# Patient Record
Sex: Female | Born: 1985
Health system: Southern US, Community
[De-identification: ages and names within clinical notes are randomized; demographics above are authoritative.]

## PROBLEM LIST (undated history)

## (undated) DIAGNOSIS — A749 Chlamydial infection, unspecified: Secondary | ICD-10-CM

## (undated) DIAGNOSIS — B999 Unspecified infectious disease: Secondary | ICD-10-CM

## (undated) DIAGNOSIS — J45909 Unspecified asthma, uncomplicated: Secondary | ICD-10-CM

## (undated) HISTORY — PX: INDUCED ABORTION: SHX677

---
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
## 2007-02-04 ENCOUNTER — Inpatient Hospital Stay (HOSPITAL_COMMUNITY): Admission: AD | Admit: 2007-02-04 | Discharge: 2007-02-04 | Payer: Self-pay | Admitting: Family Medicine

## 2007-02-09 ENCOUNTER — Inpatient Hospital Stay (HOSPITAL_COMMUNITY): Admission: AD | Admit: 2007-02-09 | Discharge: 2007-02-10 | Payer: Self-pay | Admitting: Family Medicine

## 2007-07-05 ENCOUNTER — Inpatient Hospital Stay (HOSPITAL_COMMUNITY): Admission: AD | Admit: 2007-07-05 | Discharge: 2007-07-05 | Payer: Self-pay | Admitting: Obstetrics & Gynecology

## 2007-07-05 ENCOUNTER — Ambulatory Visit: Payer: Self-pay | Admitting: *Deleted

## 2007-07-13 ENCOUNTER — Ambulatory Visit: Payer: Self-pay | Admitting: *Deleted

## 2007-07-15 ENCOUNTER — Ambulatory Visit (HOSPITAL_COMMUNITY): Admission: RE | Admit: 2007-07-15 | Discharge: 2007-07-15 | Payer: Self-pay | Admitting: Obstetrics and Gynecology

## 2007-07-20 ENCOUNTER — Ambulatory Visit: Payer: Self-pay | Admitting: Obstetrics & Gynecology

## 2007-07-27 ENCOUNTER — Inpatient Hospital Stay (HOSPITAL_COMMUNITY): Admission: AD | Admit: 2007-07-27 | Discharge: 2007-07-27 | Payer: Self-pay | Admitting: Obstetrics & Gynecology

## 2007-07-27 ENCOUNTER — Ambulatory Visit: Payer: Self-pay | Admitting: Obstetrics & Gynecology

## 2007-07-31 ENCOUNTER — Ambulatory Visit: Payer: Self-pay | Admitting: Obstetrics and Gynecology

## 2007-07-31 ENCOUNTER — Inpatient Hospital Stay (HOSPITAL_COMMUNITY): Admission: AD | Admit: 2007-07-31 | Discharge: 2007-08-02 | Payer: Self-pay | Admitting: Obstetrics and Gynecology

## 2008-04-13 ENCOUNTER — Inpatient Hospital Stay (HOSPITAL_COMMUNITY): Admission: AD | Admit: 2008-04-13 | Discharge: 2008-04-13 | Payer: Self-pay | Admitting: Obstetrics & Gynecology

## 2008-07-15 ENCOUNTER — Ambulatory Visit: Payer: Self-pay | Admitting: Obstetrics and Gynecology

## 2008-07-15 ENCOUNTER — Inpatient Hospital Stay (HOSPITAL_COMMUNITY): Admission: AD | Admit: 2008-07-15 | Discharge: 2008-07-15 | Payer: Self-pay | Admitting: Family Medicine

## 2008-08-18 ENCOUNTER — Inpatient Hospital Stay (HOSPITAL_COMMUNITY): Admission: AD | Admit: 2008-08-18 | Discharge: 2008-08-18 | Payer: Self-pay | Admitting: Obstetrics and Gynecology

## 2008-08-18 ENCOUNTER — Ambulatory Visit: Payer: Self-pay | Admitting: Obstetrics and Gynecology

## 2008-10-21 ENCOUNTER — Inpatient Hospital Stay (HOSPITAL_COMMUNITY): Admission: AD | Admit: 2008-10-21 | Discharge: 2008-10-21 | Payer: Self-pay | Admitting: Obstetrics & Gynecology

## 2008-10-23 ENCOUNTER — Ambulatory Visit: Payer: Self-pay | Admitting: Family Medicine

## 2008-10-23 ENCOUNTER — Inpatient Hospital Stay (HOSPITAL_COMMUNITY): Admission: AD | Admit: 2008-10-23 | Discharge: 2008-10-26 | Payer: Self-pay | Admitting: Obstetrics & Gynecology

## 2010-07-19 ENCOUNTER — Emergency Department (HOSPITAL_COMMUNITY)
Admission: EM | Admit: 2010-07-19 | Discharge: 2010-07-19 | Payer: Self-pay | Source: Home / Self Care | Admitting: Emergency Medicine

## 2010-11-20 LAB — TYPE AND SCREEN
Antibody Screen: POSITIVE
DAT, IgG: NEGATIVE

## 2010-11-20 LAB — RUBELLA SCREEN: Rubella: 46.5 IU/mL — ABNORMAL HIGH

## 2010-11-20 LAB — ABO/RH: ABO/RH(D): B POS

## 2010-11-20 LAB — STREP B DNA PROBE

## 2010-11-20 LAB — HIV ANTIBODY (ROUTINE TESTING W REFLEX): HIV: NONREACTIVE

## 2010-11-20 LAB — CBC
HCT: 26.4 % — ABNORMAL LOW (ref 36.0–46.0)
HCT: 26.1 % — ABNORMAL LOW (ref 36.0–46.0)
Hemoglobin: 8.7 g/dL — ABNORMAL LOW (ref 12.0–15.0)
MCHC: 30.9 g/dL (ref 30.0–36.0)
MCV: 60.1 fL — ABNORMAL LOW (ref 78.0–100.0)
MCV: 60.3 fL — ABNORMAL LOW (ref 78.0–100.0)
Platelets: 210 10*3/uL (ref 150–400)
Platelets: 220 10*3/uL (ref 150–400)
Platelets: 237 10*3/uL (ref 150–400)
RBC: 4.23 MIL/uL (ref 3.87–5.11)
RBC: 4.38 MIL/uL (ref 3.87–5.11)
RDW: 23.8 % — ABNORMAL HIGH (ref 11.5–15.5)
RDW: 24 % — ABNORMAL HIGH (ref 11.5–15.5)

## 2010-11-20 LAB — HEPATITIS B SURFACE ANTIGEN: Hepatitis B Surface Ag: NEGATIVE

## 2010-11-20 LAB — RPR
RPR Ser Ql: NONREACTIVE
RPR Ser Ql: NONREACTIVE

## 2010-11-20 LAB — RAPID URINE DRUG SCREEN, HOSP PERFORMED
Amphetamines: NOT DETECTED
Cocaine: NOT DETECTED

## 2010-11-24 LAB — URINALYSIS, ROUTINE W REFLEX MICROSCOPIC
Protein, ur: 30 mg/dL — AB
Specific Gravity, Urine: >1.03 — ABNORMAL HIGH (ref 1.005–1.030)
Urobilinogen, UA: 0.2 mg/dL (ref 0.0–1.0)

## 2010-11-24 LAB — RAPID URINE DRUG SCREEN, HOSP PERFORMED
Benzodiazepines: NOT DETECTED
Cocaine: NOT DETECTED
Tetrahydrocannabinol: NOT DETECTED

## 2010-11-24 LAB — URINE MICROSCOPIC-ADD ON

## 2010-11-24 LAB — WET PREP, GENITAL
Clue Cells Wet Prep HPF POC: NONE SEEN
Trich, Wet Prep: NONE SEEN

## 2010-12-23 NOTE — Op Note (Signed)
NAMEBRYER, GOTTSCH NO.:  [id number]   MEDICAL RECORD NO.:  [id number]          PATIENT TYPE:  INP   LOCATION:  9114                          FACILITY:  [facility]   PHYSICIAN:  Scheryl Darter, MD       DATE OF BIRTH:  Nov 29, 1985   DATE OF PROCEDURE:  10/22/2008  DATE OF DISCHARGE:                               OPERATIVE REPORT   DESCRIPTION OF DELIVERY:  Ms. Valencia Kassa 25 year old gravida 2, now  para 2 who presented in advanced labor.  She had no prenatal care during  this pregnancy.  Based on last menstrual period and confirmation by an  ultrasound at 30 weeks, the patient was [redacted] weeks gestational age.  She  progressed to complete, complete with adequate pushing efforts and  delivered via spontaneous vaginal delivery.  The head presented right  occiput anterior and a shoulder dystocia was quickly noted.  There was  no nuchal cord.  Nurses put the patient in McRoberts position and  applied suprapubic pressure.  A Woods screw maneuver was done to rotate  the anterior shoulder, which was the baby's left shoulder towards the  patient's left side which facilitate delivery of the anterior shoulder.  Total time of dystocia was approximately 50 seconds.  The anterior  shoulder was then delivered spontaneously followed by the posterior  shoulder and the rest of the corpus without difficulty.  The mouth and  nares were bulb suctioned.  The cord was clamped and cut, and then  transfered to the warmer with the awaiting delivery room nurse, there  was a spontaneous cry, good tone.  Both arms were noted to be moving.  The placenta then delivered spontaneously and intact.  There was a three-  vessel cord.  There were no lacerations noted upon delivery of placenta.  Some bleeding was noted.  This responded to uterine massage.  Then,  another episode of bleeding occurred which the patient was then given  Methergine IM as well as Cytotec PR with an additional 20 units of  Pitocin  placed in the IV bag.  Bleeding decreased at this time.  Estimated blood loss was 700 mL.  Mother and baby are in good condition.      Odie Sera, DO      Scheryl Darter, MD  Electronically Signed    MC/MEDQ  D:  10/24/2008  T:  10/24/2008  Job:  [id number]

## 2011-05-13 LAB — WET PREP, GENITAL: Yeast Wet Prep HPF POC: NONE SEEN

## 2011-05-15 LAB — GC/CHLAMYDIA PROBE AMP, GENITAL
Chlamydia, DNA Probe: POSITIVE — AB
GC Probe Amp, Genital: POSITIVE — AB

## 2011-05-15 LAB — CBC
HCT: 18 — ABNORMAL LOW
HCT: 20.6 — ABNORMAL LOW
HCT: 29.3 — ABNORMAL LOW
Hemoglobin: 5.3 — CL
Hemoglobin: 6 — CL
Hemoglobin: 7 — CL
Hemoglobin: 9.7 — ABNORMAL LOW
Hemoglobin: 9.9 — ABNORMAL LOW
MCHC: 33.1
MCHC: 33.5
MCHC: 34.4
MCV: 69.4 — ABNORMAL LOW
MCV: 70.1 — ABNORMAL LOW
MCV: 72.6 — ABNORMAL LOW
Platelets: 193
Platelets: 216
Platelets: 227
Platelets: 264
RBC: 2.83 — ABNORMAL LOW
RBC: 4.18
RBC: 4.23
RDW: 18.4 — ABNORMAL HIGH
RDW: 18.5 — ABNORMAL HIGH
RDW: 18.7 — ABNORMAL HIGH
WBC: 10.1
WBC: 20.7 — ABNORMAL HIGH
WBC: 8

## 2011-05-15 LAB — CROSSMATCH
Antibody Screen: POSITIVE
DAT, IgG: NEGATIVE

## 2011-05-15 LAB — URINALYSIS, ROUTINE W REFLEX MICROSCOPIC
Bilirubin Urine: NEGATIVE
Glucose, UA: NEGATIVE mg/dL
Hgb urine dipstick: NEGATIVE
Ketones, ur: NEGATIVE mg/dL
Nitrite: NEGATIVE
Protein, ur: NEGATIVE mg/dL
Specific Gravity, Urine: 1.02 (ref 1.005–1.030)
Urobilinogen, UA: 0.2 mg/dL (ref 0.0–1.0)
pH: 7 (ref 5.0–8.0)

## 2011-05-15 LAB — URINE CULTURE: Colony Count: 2000

## 2011-05-15 LAB — COMPREHENSIVE METABOLIC PANEL
ALT: 11
Albumin: 2.5 — ABNORMAL LOW
Alkaline Phosphatase: 153 — ABNORMAL HIGH
BUN: 2 — ABNORMAL LOW
Chloride: 103
Glucose, Bld: 91
Potassium: 3.6
Sodium: 133 — ABNORMAL LOW
Total Bilirubin: 0.6
Total Protein: 7.3

## 2011-05-15 LAB — POCT URINALYSIS DIP (DEVICE)
Bilirubin Urine: NEGATIVE
Glucose, UA: NEGATIVE
Ketones, ur: NEGATIVE
Nitrite: NEGATIVE
Operator id: 148111

## 2011-05-15 LAB — RPR
RPR Ser Ql: NONREACTIVE
RPR Ser Ql: NONREACTIVE

## 2011-05-15 LAB — WET PREP, GENITAL

## 2011-05-15 LAB — LACTATE DEHYDROGENASE: LDH: 156

## 2011-05-15 LAB — URIC ACID: Uric Acid, Serum: 5.2

## 2011-05-18 LAB — POCT URINALYSIS DIP (DEVICE)
Bilirubin Urine: NEGATIVE
Glucose, UA: NEGATIVE
Glucose, UA: NEGATIVE
Hgb urine dipstick: NEGATIVE
Nitrite: NEGATIVE
Nitrite: NEGATIVE
Urobilinogen, UA: 0.2
Urobilinogen, UA: 1
pH: 6.5

## 2011-05-19 LAB — URINALYSIS, ROUTINE W REFLEX MICROSCOPIC
Bilirubin Urine: NEGATIVE
Hgb urine dipstick: NEGATIVE
Nitrite: NEGATIVE
Protein, ur: NEGATIVE
Urobilinogen, UA: 0.2

## 2011-05-19 LAB — RUBELLA SCREEN: Rubella: 47.2 — ABNORMAL HIGH

## 2011-05-19 LAB — RAPID URINE DRUG SCREEN, HOSP PERFORMED
Amphetamines: NOT DETECTED
Barbiturates: NOT DETECTED
Opiates: NOT DETECTED

## 2011-05-19 LAB — SICKLE CELL SCREEN: Sickle Cell Screen: NEGATIVE

## 2011-05-19 LAB — RPR: RPR Ser Ql: NONREACTIVE

## 2011-05-19 LAB — TYPE AND SCREEN
ABO/RH(D): B POS
Antibody Screen: POSITIVE
PT AG Type: NEGATIVE

## 2011-05-19 LAB — DIFFERENTIAL
Eosinophils Relative: 4
Lymphocytes Relative: 34
Lymphs Abs: 3.1
Monocytes Absolute: 1.2 — ABNORMAL HIGH
Monocytes Relative: 13 — ABNORMAL HIGH

## 2011-05-19 LAB — WET PREP, GENITAL: Yeast Wet Prep HPF POC: NONE SEEN

## 2011-05-19 LAB — CBC
HCT: 28.1 — ABNORMAL LOW
Hemoglobin: 9.3 — ABNORMAL LOW
Platelets: 260
RBC: 3.86 — ABNORMAL LOW
WBC: 9.2

## 2011-05-19 LAB — STREP B DNA PROBE: Strep Group B Ag: NEGATIVE

## 2011-05-19 LAB — HEPATITIS B SURFACE ANTIGEN: Hepatitis B Surface Ag: NEGATIVE

## 2011-05-26 LAB — URINE MICROSCOPIC-ADD ON

## 2011-05-26 LAB — WET PREP, GENITAL

## 2011-05-26 LAB — URINALYSIS, ROUTINE W REFLEX MICROSCOPIC
Specific Gravity, Urine: <1.005 — ABNORMAL LOW
Urobilinogen, UA: 0.2
pH: 6.5

## 2011-05-27 LAB — URINE CULTURE: Colony Count: 80000

## 2011-05-27 LAB — URINALYSIS, ROUTINE W REFLEX MICROSCOPIC
Glucose, UA: NEGATIVE
Nitrite: NEGATIVE
Specific Gravity, Urine: >1.03 — ABNORMAL HIGH
pH: 6

## 2011-05-27 LAB — URINE MICROSCOPIC-ADD ON

## 2011-05-27 LAB — WET PREP, GENITAL

## 2013-08-10 NOTE — L&D Delivery Note (Signed)
  Lucila MaineStroman, Girl Britta MccreedySharine [[id number]][[id number]]  Delivery Note At 8:37 PM a viable female was delivered via Vaginal, Spontaneous Delivery (Presentation: ;  ).  APGAR:pending weight 5 lb 13.7 oz (2655 g).   Placenta status: intact Cord:  with the following complications: .    Anesthesia:  none Episiotomy:  none Lacerations:  none Est. Blood Loss (mL):  400cc  Mom to OR for delivery of Twin B .  Baby to Nursery.   HARRAWAY-SMITH, Mckell Riecke 08/08/2014, 9:54 PM     Alda PonderStroman, GirlB Nicolasa [[id number]][[id number]]  Delivery Note At 8:53 PM a viable female was delivered via  Primary c-section.  APGAR: 4, 8; weight 5 lb 11.9 oz (2605 g).   Placenta status:intact .  Cord: 3 vessels  .    Anesthesia:  general Episiotomy:  n/a Lacerations:  N/a  Mom to PACU then postpartum.  Baby to Nursery.  HARRAWAY-SMITH, Darald Uzzle 08/08/2014, 9:54 PM

## 2013-09-25 ENCOUNTER — Ambulatory Visit: Payer: Self-pay

## 2014-07-19 ENCOUNTER — Encounter (HOSPITAL_COMMUNITY): Payer: Self-pay | Admitting: *Deleted

## 2014-07-19 ENCOUNTER — Inpatient Hospital Stay (HOSPITAL_COMMUNITY): Payer: Medicaid Other

## 2014-07-19 ENCOUNTER — Inpatient Hospital Stay (HOSPITAL_COMMUNITY)
Admission: AD | Admit: 2014-07-19 | Discharge: 2014-07-26 | DRG: 782 | Disposition: A | Discharge status: Home / Self Care | Payer: Medicaid Other | Source: Ambulatory Visit | Attending: Obstetrics & Gynecology | Admitting: Obstetrics & Gynecology

## 2014-07-19 DIAGNOSIS — Z3A34 34 weeks gestation of pregnancy: Secondary | ICD-10-CM

## 2014-07-19 DIAGNOSIS — O322XX0 Maternal care for transverse and oblique lie, not applicable or unspecified: Secondary | ICD-10-CM | POA: Diagnosis present

## 2014-07-19 DIAGNOSIS — O4693 Antepartum hemorrhage, unspecified, third trimester: Secondary | ICD-10-CM | POA: Diagnosis present

## 2014-07-19 DIAGNOSIS — O0930 Supervision of pregnancy with insufficient antenatal care, unspecified trimester: Secondary | ICD-10-CM

## 2014-07-19 DIAGNOSIS — Z880 Allergy status to penicillin: Secondary | ICD-10-CM

## 2014-07-19 DIAGNOSIS — O30003 Twin pregnancy, unspecified number of placenta and unspecified number of amniotic sacs, third trimester: Secondary | ICD-10-CM | POA: Diagnosis present

## 2014-07-19 DIAGNOSIS — Z3A35 35 weeks gestation of pregnancy: Secondary | ICD-10-CM | POA: Diagnosis present

## 2014-07-19 DIAGNOSIS — O0933 Supervision of pregnancy with insufficient antenatal care, third trimester: Secondary | ICD-10-CM

## 2014-07-19 DIAGNOSIS — R58 Hemorrhage, not elsewhere classified: Secondary | ICD-10-CM

## 2014-07-19 HISTORY — DX: Unspecified infectious disease: B99.9

## 2014-07-19 HISTORY — DX: Chlamydial infection, unspecified: A74.9

## 2014-07-19 LAB — OB RESULTS CONSOLE GBS: GBS: NEGATIVE

## 2014-07-19 LAB — COMPREHENSIVE METABOLIC PANEL
ALBUMIN: 2.5 g/dL — AB (ref 3.5–5.2)
ALK PHOS: 134 U/L — AB (ref 39–117)
ALT: 7 U/L (ref 0–35)
AST: 15 U/L (ref 0–37)
Anion gap: 12 (ref 5–15)
BILIRUBIN TOTAL: 0.5 mg/dL (ref 0.3–1.2)
BUN: 3 mg/dL — AB (ref 6–23)
CHLORIDE: 99 meq/L (ref 96–112)
CO2: 25 mEq/L (ref 19–32)
Calcium: 9 mg/dL (ref 8.4–10.5)
Creatinine, Ser: 0.53 mg/dL (ref 0.50–1.10)
GFR calc Af Amer: >90 mL/min (ref >90)
GFR calc non Af Amer: >90 mL/min (ref >90)
Glucose, Bld: 76 mg/dL (ref 70–99)
POTASSIUM: 3.3 meq/L — AB (ref 3.7–5.3)
SODIUM: 136 meq/L — AB (ref 137–147)
TOTAL PROTEIN: 6.8 g/dL (ref 6.0–8.3)

## 2014-07-19 LAB — DIFFERENTIAL
BASOS ABS: 0 10*3/uL (ref 0.0–0.1)
Basophils Relative: 0 % (ref 0–1)
EOS ABS: 0.3 10*3/uL (ref 0.0–0.7)
Eosinophils Relative: 3 % (ref 0–5)
LYMPHS PCT: 30 % (ref 12–46)
Lymphs Abs: 2.8 10*3/uL (ref 0.7–4.0)
MONOS PCT: 9 % (ref 3–12)
Monocytes Absolute: 0.8 10*3/uL (ref 0.1–1.0)
Neutro Abs: 5.3 10*3/uL (ref 1.7–7.7)
Neutrophils Relative %: 58 % (ref 43–77)

## 2014-07-19 LAB — CBC
HCT: 27.6 % — ABNORMAL LOW (ref 36.0–46.0)
Hemoglobin: 9.6 g/dL — ABNORMAL LOW (ref 12.0–15.0)
MCH: 25.8 pg — ABNORMAL LOW (ref 26.0–34.0)
MCHC: 34.8 g/dL (ref 30.0–36.0)
MCV: 74.2 fL — ABNORMAL LOW (ref 78.0–100.0)
PLATELETS: 174 10*3/uL (ref 150–400)
RBC: 3.72 MIL/uL — ABNORMAL LOW (ref 3.87–5.11)
RDW: 14.5 % (ref 11.5–15.5)
WBC: 9.2 10*3/uL (ref 4.0–10.5)

## 2014-07-19 LAB — WET PREP, GENITAL
Trich, Wet Prep: NONE SEEN
Yeast Wet Prep HPF POC: NONE SEEN

## 2014-07-19 LAB — HIV ANTIBODY (ROUTINE TESTING W REFLEX): HIV: NONREACTIVE

## 2014-07-19 LAB — RAPID URINE DRUG SCREEN, HOSP PERFORMED
Amphetamines: NOT DETECTED
BARBITURATES: NOT DETECTED
BENZODIAZEPINES: NOT DETECTED
Cocaine: NOT DETECTED
Opiates: NOT DETECTED
Tetrahydrocannabinol: POSITIVE — AB

## 2014-07-19 LAB — HEPATITIS B SURFACE ANTIGEN: HEP B S AG: NEGATIVE

## 2014-07-19 LAB — OB RESULTS CONSOLE GC/CHLAMYDIA
CHLAMYDIA, DNA PROBE: NEGATIVE
GC PROBE AMP, GENITAL: NEGATIVE

## 2014-07-19 LAB — RPR

## 2014-07-19 LAB — GROUP B STREP BY PCR: Group B strep by PCR: NEGATIVE

## 2014-07-19 LAB — RAPID HIV SCREEN (WH-MAU): Rapid HIV Screen: NONREACTIVE

## 2014-07-19 MED ORDER — ACETAMINOPHEN 325 MG PO TABS: 650.0000 mg | ORAL_TABLET | ORAL | Status: DC | PRN

## 2014-07-19 MED ORDER — LACTATED RINGERS IV BOLUS (SEPSIS): 1000.0000 mL | Freq: Once | INTRAVENOUS | Status: DC

## 2014-07-19 MED ORDER — FERROUS SULFATE 325 (65 FE) MG PO TABS: 325.0000 mg | ORAL_TABLET | Freq: Two times a day (BID) | ORAL | Status: DC

## 2014-07-19 MED ORDER — FERROUS SULFATE 325 (65 FE) MG PO TABS
325.0000 mg | ORAL_TABLET | Freq: Two times a day (BID) | ORAL | Status: DC
  Administered 2014-07-20 – 2014-07-26 (×12): 325 mg via ORAL
  Filled 2014-07-19 (×12): qty 1

## 2014-07-19 MED ORDER — LACTATED RINGERS IV SOLN
INTRAVENOUS | Status: DC
  Administered 2014-07-19 – 2014-07-20 (×2): via INTRAVENOUS

## 2014-07-19 MED ORDER — CALCIUM CARBONATE ANTACID 500 MG PO CHEW: 2.0000 | CHEWABLE_TABLET | ORAL | Status: DC | PRN

## 2014-07-19 MED ORDER — PRENATAL MULTIVITAMIN CH
1.0000 | ORAL_TABLET | Freq: Every day | ORAL | Status: DC
  Administered 2014-07-20 – 2014-07-26 (×7): 1 via ORAL
  Filled 2014-07-19 (×7): qty 1

## 2014-07-19 MED ORDER — ZOLPIDEM TARTRATE 5 MG PO TABS: 5.0000 mg | ORAL_TABLET | Freq: Every evening | ORAL | Status: DC | PRN

## 2014-07-19 MED ORDER — DOCUSATE SODIUM 100 MG PO CAPS
100.0000 mg | ORAL_CAPSULE | Freq: Every day | ORAL | Status: DC
  Administered 2014-07-20 – 2014-07-26 (×7): 100 mg via ORAL
  Filled 2014-07-19 (×7): qty 1

## 2014-07-19 NOTE — MAU Note (Signed)
EMS pt report: 8 months preg; no prenatal care;  Felt wet, thought her water had broke, apporx 50cc blood noted.  Denies ctx's.

## 2014-07-19 NOTE — Consult Note (Signed)
Asked by Dr.Arnold to provide prenatal consultation for patient at risk for preterm delivery due to possible abruption..  Mother is 28 y.o. G6 P2-0-3-2 who is now 7934 3/[redacted] weeks EGA by LMP, but uncertain dates since her last period was "light" and she has had no prenatal care.  Had onset heavy bleeding today, which has subsided since admission. Ultrasound shows di-di twins, both female, size consistent with EGA 34+wks.  Discussed usual expectations for preterm infants at 3034 - [redacted] weeks gestation, including possible respiratory distress, feeding problems, hypoglycemia, hypothermia and possible need for care in NICU.  Projected possible length of stay in NICU until 36 - [redacted] wks EGA.  Noted slightly increased risk of twins vs singletons at same size, EGA.  Discussed advantages of feeding with mother's milk.  She plans to formula feed but agreed to pumping postnatally for benefits of early feedings. .  Patient was attentive, had appropriate questions, and was appreciative of my input.  Thank you for the consultation.  Total time 25 minute  Thanks for consulting Neonatology  Daire Okimoto E. Barrie DunkerWimmer, Jr., MD

## 2014-07-19 NOTE — Progress Notes (Signed)
CSW received call from MAU requesting CSW consult for patient due to patient arriving at [redacted] weeks gestation, with no prenatal care, and just learning of being pregnant with twins.  The CSW met with the patient and her significant other.  They presented as receptive to the visit and were easily engaged.  The patient displayed a full range in affect and presented in a pleasant mood.   The patient and her significant other shared that they were not surprised when they were learned that they were pregnancy with twins since "the pregnancy felt different than the others" and "I got large, very fast".  They also disclosed family history of twins.  The patient denied stress now that they know that they have twins since "we'll just have to go back and get one more of everything".  The patient and her significant other indicated limited support as they shared that they rely on each other and expressed hope that they will receive additional support once the twins arrive.   CSW inquired about late prenatal care.  The patient reported that they had thought about going to the MD about a month ago, but due to the significant other's work schedule, attempting to secure child care, and then there car breaking down, they were unable to make an appointment.  They reported that they are now able to attend appointments since the car has been repaired.  They denied additional barriers to prenatal care.   CSW provided education hospital drug screen for [facility].  The patient denied any substance use and verbalized understanding of protocol once the twins are born.    No barriers to discharge.

## 2014-07-19 NOTE — MAU Note (Signed)
Was laying down, just started bleeding. No water leaking. No pain.

## 2014-07-19 NOTE — MAU Provider Note (Signed)
History     CSN: [id number]  Arrival date and time: 07/19/14 1137   First Provider Initiated Contact with Patient 07/19/14 1246      Chief Complaint  Patient presents with  . Vaginal Bleeding   HPI  Ms. Victoria Mclaughlin is a 28 year old G6P2032, EGA of 7669w3d based on LMP, presenting to MAU via EMS with complaints of vaginal bleeding. She states last night at 11PM she laid down to rest and felt the baby kick and some discomfort. She then said in the morning she thought her water broke around 10:30AM. She states she felt down there and then looked at her hand and it was bloody. She then proceeded to go to the bathroom where she noted a large pool of blood on the bathroom floor. The blood color was heavy medium red. She states she has not had any problems throughout this pregnancy and no problems during prior pregnancies. She has not had any prenatal care. She denies feeling any contractions or any abnormal vaginal discharge. She states she can feel the baby move. Patient also notes she believes her LMP is on April 13, but she is unsure due to prior period was abnormally light.    Past Medical History  Diagnosis Date  . Infection     UTI  . Chlamydia     Past Surgical History  Procedure Laterality Date  . Induced abortion      x3    Family History  Problem Relation Age of Onset  . Stroke Maternal Aunt   . Hearing loss Neg Hx   . Diabetes Neg Hx   . Heart disease Neg Hx     History  Substance Use Topics  . Smoking status: Never Smoker   . Smokeless tobacco: Never Used  . Alcohol Use: No    Allergies:  Allergies  Allergen Reactions  . Penicillins Hives    Prescriptions prior to admission  Medication Sig Dispense Refill Last Dose  . Prenatal Vit-Fe Fumarate-FA (PRENATAL MULTIVITAMIN) TABS tablet Take 1 tablet by mouth daily at 12 noon.   07/18/2014 at Unknown time    Review of Systems  Constitutional: Negative for fever.  Respiratory: Negative for shortness of breath.    Cardiovascular: Negative for chest pain.  Gastrointestinal: Negative for nausea, vomiting and abdominal pain.  Genitourinary: Negative for dysuria.  Neurological: Negative for dizziness, weakness and headaches.   Physical Exam   Blood pressure 134/76, pulse 121, temperature 97.9 F (36.6 C), temperature source Oral, resp. rate 18, height 5\' 4"  (1.626 m), weight 99.791 kg (220 lb), last menstrual period 11/20/2013.  Physical Exam  Constitutional: She appears well-developed and well-nourished.  HENT:  Head: Normocephalic.  Cardiovascular: Regular rhythm and normal heart sounds.   Tachycardic   Respiratory: Effort normal and breath sounds normal. No respiratory distress.  GI:  Gravida  Musculoskeletal:  Right and left foot display dependent edema  Neurological: She is alert.  Skin: Skin is warm and dry.    MAU Course  Procedures  MDM Due to patient stating she lost a lot of blood this morning on the bathroom floor, will insert two large bore IVs. Will bolus patient due to tachycardia and blood loss. Will order prenatal tests due to lack of prenatal care. Will also order u/s to rule out placenta previa.   Assessment and Plan    Karma LewFong, Chelsea K 07/19/2014, 1:29 PM   I was present for the exam and agree with above.  [place] Kayo [place], [place] 07/19/2014 8:24  PM   

## 2014-07-19 NOTE — H&P (Signed)
History     CSN: [id number]  Arrival date and time: 07/19/14 1137  First Provider Initiated Contact with Patient 07/19/14 1246    Chief Complaint  Patient presents with  . Vaginal Bleeding   HPI  Victoria Mclaughlin is a 28 year old G6P2032, EGA of 5847w3d based on LMP, presenting to MAU via EMS with complaints of vaginal bleeding. She states last night at 11PM she laid down to rest and felt the baby kick and some discomfort. She then said in the morning she thought her water broke around 10:30AM. She states she felt down there and then looked at her hand and it was bloody. She then proceeded to go to the bathroom where she noted a large pool of blood on the bathroom floor. The blood color was heavy medium red. She states she has not had any problems throughout this pregnancy and no problems during prior pregnancies. She has not had any prenatal care. She denies feeling any contractions or any abnormal vaginal discharge. She states she can feel the baby move. Patient also notes she believes her LMP is on April 13, but she is unsure due to prior period was abnormally light.    Past Medical History  Diagnosis Date  . Infection     UTI  . Chlamydia     Past Surgical History  Procedure Laterality Date  . Induced abortion      x3    Family History  Problem Relation Age of Onset  . Stroke Maternal Aunt   . Hearing loss Neg Hx   . Diabetes Neg Hx   . Heart disease Neg Hx     History  Substance Use Topics  . Smoking status: Never Smoker   . Smokeless tobacco: Never Used  . Alcohol Use: No    Allergies:  Allergies  Allergen Reactions  . Penicillins Hives    Prescriptions prior to admission  Medication Sig Dispense Refill Last Dose  . Prenatal Vit-Fe Fumarate-FA (PRENATAL MULTIVITAMIN) TABS tablet Take 1 tablet by mouth daily at 12 noon.   07/18/2014 at Unknown time    Review of  Systems  Constitutional: Negative for fever.  Respiratory: Negative for shortness of breath.  Cardiovascular: Negative for chest pain.  Gastrointestinal: Negative for nausea, vomiting and abdominal pain.  Genitourinary: Negative for dysuria.  Neurological: Negative for dizziness, weakness and headaches.   Physical Exam   Blood pressure 134/76, pulse 121, temperature 97.9 F (36.6 C), temperature source Oral, resp. rate 18, height 5\' 4"  (1.626 m), weight 99.791 kg (220 lb), last menstrual period 11/20/2013.  Physical Exam  Constitutional: She appears well-developed and well-nourished.  HENT:  Head: Normocephalic.  Cardiovascular: Regular rhythm and normal heart sounds.  Tachycardic  Respiratory: Effort normal and breath sounds normal. No respiratory distress.  GI:  Gravida  Musculoskeletal:  Right and left foot display dependent edema  Neurological: She is alert.  Skin: Skin is warm and dry.    MAU Course  Procedures  MDM Due to patient stating she lost a lot of blood this morning on the bathroom floor, will insert two large bore IVs. Will bolus patient due to tachycardia and blood loss. Will order prenatal tests due to lack of prenatal care. Will also order u/s to rule out placenta previa.  Assessment and Plan    Karma LewFong, Chelsea K 07/19/2014, 1:29 PM   US shows DI/Di twin IUP, no previa or abruption seen. Normal fluid. Dates match LMP. CL 3.6 cm.   PELVIC EXAM: NEFG,  small -mod amount of BRB. No LOF. Cervix clean.   Cervix 1/long/ballotable, vtx twin A  FHR reactive x 2 Few, mild contractions  Assessment: 1. 34.3 week IUP w/ Third trimester bleeding, possible abruption. Bleeding stable wile in MAU. 2. Di/Di Twins 3. Fetal Wellbeing: Category I x 2  4. No PNC 5. GBS, UDS Pending  Plan:  1. Admit to Antenatal per consult with Dr. Debroah LoopArnold 2. Routine Ante orders 3. NICU and SW consults 4. 1 hour GTT in am [facility]. [facility]  I was present for the exam and  agree with above.  [place] Jaiyah Beining, CNM 07/19/2014 8:24 PM

## 2014-07-19 NOTE — MAU Note (Signed)
C/o heavy medium red bleeding this AM around 1100;

## 2014-07-20 LAB — GLUCOSE TOLERANCE, 1 HOUR: Glucose, 1 Hour GTT: 143 mg/dL — ABNORMAL HIGH (ref 70–140)

## 2014-07-20 LAB — RUBELLA SCREEN: RUBELLA: 1.78 {index} — AB (ref <0.90)

## 2014-07-20 LAB — GC/CHLAMYDIA PROBE AMP
CT Probe RNA: NEGATIVE
GC Probe RNA: NEGATIVE

## 2014-07-20 NOTE — Progress Notes (Signed)
[facility]  CSW screening out referral due to patient being referred for the same reason yesterday and being seen by S. Venning/LCSW.

## 2014-07-20 NOTE — Progress Notes (Signed)
UR completed 

## 2014-07-20 NOTE — Progress Notes (Signed)
FACULTY PRACTICE ANTEPARTUM(COMPREHENSIVE) NOTE  Victoria CarrelSharine A Mclaughlin is a 28 y.o. [id number] at 3935w4d by LMP who is admitted for vaginal bleeding.   Fetal presentation is cephalic and transverse. Length of Stay:  1  Days  Subjective: Now just pink discharge Patient reports the fetal movement as active. Patient reports uterine contraction  activity as none. Patient reports  vaginal bleeding as scant staining. Patient describes fluid per vagina as None.  Vitals:  Blood pressure 117/79, pulse 104, temperature 98.5 F (36.9 C), temperature source Oral, resp. rate 24, height 5\' 4"  (1.626 m), weight 99.791 kg (220 lb), last menstrual period 11/20/2013. Physical Examination:  General appearance - alert, well appearing, and in no distress Heart - normal rate and regular rhythm Abdomen - soft, nontender, nondistended Fundal Height:  consistent with twins Membranes:intact   Most Recent FHR        Most Recent Value    Fetal Heart Rate A     Mode  External filed at 07/20/2014 0600    Baseline Rate (A)  125 bpm filed at 07/20/2014 0600    Variability  6-25 BPM filed at 07/20/2014 0600    Accelerations  15 x 15 filed at 07/20/2014 0600    Decelerations  None filed at 07/20/2014 0600    Multiple birth?  Yes filed at 07/19/2014 1628    Fetal Heart Rate Fetus B     Mode  External filed at 07/20/2014 0600    Baseline Rate (B)  135 BPM filed at 07/20/2014 0600    Variability  6-25 BPM filed at 07/20/2014 0600    Accelerations  15 x 15 filed at 07/20/2014 0600    Decelerations  None filed at 07/20/2014 0600      Labs:  Results for orders placed or performed during the hospital encounter of 07/19/14 (from the past 24 hour(s))  Hepatitis B surface antigen   Collection Time: 07/19/14  4:35 PM  Result Value Ref Range   Hepatitis B Surface Ag NEGATIVE NEGATIVE  RPR   Collection Time: 07/19/14  4:35 PM  Result Value Ref Range   RPR NON REAC NON REAC  CBC    Collection Time: 07/19/14  4:35 PM  Result Value Ref Range   WBC 9.2 4.0 - 10.5 K/uL   RBC 3.72 (L) 3.87 - 5.11 MIL/uL   Hemoglobin 9.6 (L) 12.0 - 15.0 g/dL   HCT 14.727.6 (L) 82.936.0 - 56.246.0 %   MCV 74.2 (L) 78.0 - 100.0 fL   MCH 25.8 (L) 26.0 - 34.0 pg   MCHC 34.8 30.0 - 36.0 g/dL   RDW 13.014.5 86.511.5 - 78.415.5 %   Platelets 174 150 - 400 K/uL  Differential   Collection Time: 07/19/14  4:35 PM  Result Value Ref Range   Neutrophils Relative % 58 43 - 77 %   Lymphocytes Relative 30 12 - 46 %   Monocytes Relative 9 3 - 12 %   Eosinophils Relative 3 0 - 5 %   Basophils Relative 0 0 - 1 %   Neutro Abs 5.3 1.7 - 7.7 K/uL   Lymphs Abs 2.8 0.7 - 4.0 K/uL   Monocytes Absolute 0.8 0.1 - 1.0 K/uL   Eosinophils Absolute 0.3 0.0 - 0.7 K/uL   Basophils Absolute 0.0 0.0 - 0.1 K/uL   Smear Review MORPHOLOGY UNREMARKABLE   HIV antibody (routine testing)   Collection Time: 07/19/14  4:35 PM  Result Value Ref Range   HIV 1&2 Ab, 4th Generation NONREACTIVE NONREACTIVE  Rapid HIV screen [facility] admission)   Collection Time: 07/19/14  4:35 PM  Result Value Ref Range   SUDS Rapid HIV Screen NON REACTIVE NON REACTIVE  Comprehensive metabolic panel   Collection Time: 07/19/14  4:35 PM  Result Value Ref Range   Sodium 136 (L) 137 - 147 mEq/L   Potassium 3.3 (L) 3.7 - 5.3 mEq/L   Chloride 99 96 - 112 mEq/L   CO2 25 19 - 32 mEq/L   Glucose, Bld 76 70 - 99 mg/dL   BUN 3 (L) 6 - 23 mg/dL   Creatinine, Ser 0.980.53 0.50 - 1.10 mg/dL   Calcium 9.0 8.4 - 11.910.5 mg/dL   Total Protein 6.8 6.0 - 8.3 g/dL   Albumin 2.5 (L) 3.5 - 5.2 g/dL   AST 15 0 - 37 U/L   ALT 7 0 - 35 U/L   Alkaline Phosphatase 134 (H) 39 - 117 U/L   Total Bilirubin 0.5 0.3 - 1.2 mg/dL   GFR calc non Af Amer >90 >90 mL/min   GFR calc Af Amer >90 >90 mL/min   Anion gap 12 5 - 15  Type and screen   Collection Time: 07/19/14  4:35 PM  Result Value Ref Range   ABO/RH(D) B POS    Antibody Screen POS    Sample Expiration 07/22/2014    Antibody  Identification ANTI LEA (Lewis a)    DAT, IgG NEG    Unit Number [id number]    Blood Component Type RED CELLS,LR    Unit division 00    Status of Unit ALLOCATED    Transfusion Status OK TO TRANSFUSE    Crossmatch Result COMPATIBLE    Unit Number [id number]    Blood Component Type RED CELLS,LR    Unit division 00    Status of Unit ALLOCATED    Transfusion Status OK TO TRANSFUSE    Crossmatch Result COMPATIBLE   Group B strep by PCR   Collection Time: 07/19/14  6:10 PM  Result Value Ref Range   Group B strep by PCR NEGATIVE NEGATIVE  Wet prep, genital   Collection Time: 07/19/14  6:10 PM  Result Value Ref Range   Yeast Wet Prep HPF POC NONE SEEN NONE SEEN   Trich, Wet Prep NONE SEEN NONE SEEN   Clue Cells Wet Prep HPF POC FEW (A) NONE SEEN   WBC, Wet Prep HPF POC FEW (A) NONE SEEN  Urine rapid drug screen (hosp performed)   Collection Time: 07/19/14  6:23 PM  Result Value Ref Range   Opiates NONE DETECTED NONE DETECTED   Cocaine NONE DETECTED NONE DETECTED   Benzodiazepines NONE DETECTED NONE DETECTED   Amphetamines NONE DETECTED NONE DETECTED   Tetrahydrocannabinol POSITIVE (A) NONE DETECTED   Barbiturates NONE DETECTED NONE DETECTED  Glucose tolerance, 1 hour   Collection Time: 07/20/14  6:30 AM  Result Value Ref Range   Glucose, 1 Hour GTT 143 (H) 70 - 140 mg/dL      Medications:  Scheduled . docusate sodium  100 mg Oral Daily  . ferrous sulfate  325 mg Oral BID WC  . prenatal multivitamin  1 tablet Oral Q1200   I have reviewed the patient's current medications.  ASSESSMENT: Patient Active Problem List   Diagnosis Date Noted  . No prenatal care in current pregnancy 07/19/2014  . Third trimester bleeding 07/19/2014  . No prenatal care in current pregnancy in third trimester 07/19/2014  . Twin gestation in third trimester   . [redacted]  weeks gestation of pregnancy   . Bleeding   Abnl diabetes screen  PLAN: Observe for recurrent vaginal bleeding 3 hr  GTT Rip Hawes 07/20/2014,7:35 AM

## 2014-07-21 LAB — GLUCOSE, 3 HOUR GESTATIONAL: GLUCOSE 3 HOUR GTT: 124 mg/dL (ref 70–144)

## 2014-07-21 LAB — GLUCOSE, 1 HOUR GESTATIONAL: Glucose Tolerance, 1 hour: 131 mg/dL (ref 70–189)

## 2014-07-21 LAB — GLUCOSE, 2 HOUR GESTATIONAL: Glucose Tolerance, 2 hour: 146 mg/dL (ref 70–164)

## 2014-07-21 LAB — GLUCOSE, FASTING GESTATIONAL: GLUCOSE, FASTING-GESTATIONAL: 66 mg/dL

## 2014-07-21 MED ORDER — SODIUM CHLORIDE 0.9 % IJ SOLN
3.0000 mL | INTRAMUSCULAR | Status: DC | PRN
  Administered 2014-07-21: 3 mL via INTRAVENOUS

## 2014-07-21 MED ORDER — SODIUM CHLORIDE 0.9 % IJ SOLN
3.0000 mL | Freq: Two times a day (BID) | INTRAMUSCULAR | Status: DC
  Administered 2014-07-21 – 2014-07-23 (×4): 3 mL via INTRAVENOUS

## 2014-07-21 NOTE — Progress Notes (Signed)
Patient ID: Victoria Mclaughlin, female   DOB: 02/28/1986, 28 y.o.   MRN: [id number] FACULTY PRACTICE ANTEPARTUM(COMPREHENSIVE) NOTE  Victoria Mclaughlin is a 28 y.o. [id number] at 1369w5d by late u/s who is admitted for bleeding, single episode, after no prenatal care to date., found to have twin gestation.   Fetal presentation is cephalic and transverse B. Length of Stay:  2  Days  Subjective: Pt denies any contraction or bleeding Patient reports the fetal movement as active. Patient reports uterine contraction  activity as none. Patient reports  vaginal bleeding as none. Patient describes fluid per vagina as None.  Vitals:  Blood pressure 133/75, pulse 94, temperature 98.2 F (36.8 C), temperature source Oral, resp. rate 18, height 5\' 4"  (1.626 m), weight 220 lb (99.791 kg), last menstrual period 11/20/2013. Physical Examination:  General appearance - alert, well appearing, and in no distress Heart - normal rate and regular rhythm Abdomen - soft, nontender, nondistended Fundal Height:  size equals dates Cervical Exam: Not evaluated. A Extremities: extremities normal, atraumatic, no cyanosis or edema and Homans sign is negative, no sign of DVT with DTRs 2+ bilaterally Membranes:intact  Fetal Monitoring:  Daily nst's normal  Labs:  Results for orders placed or performed during the hospital encounter of 07/19/14 (from the past 24 hour(s))  Glucose, fasting gestational   Collection Time: 07/21/14  5:05 AM  Result Value Ref Range   Glucose, Fasting-Gestational 66 mg/dL  Glucose, 1 hour gestational   Collection Time: 07/21/14  6:45 AM  Result Value Ref Range   Glucose, 1 Hour-Gestational 131 70 - 189 mg/dL    Imaging Studies:     Currently EPIC will not allow sonographic studies to automatically populate into notes.  In the meantime, copy and paste results into note or free text.  Medications:  Scheduled . docusate sodium  100 mg Oral Daily  . ferrous sulfate  325 mg Oral BID WC  .  prenatal multivitamin  1 tablet Oral Q1200   I have reviewed the patient's current medications.  ASSESSMENT: Patient Active Problem List   Diagnosis Date Noted  . No prenatal care in current pregnancy 07/19/2014  . Third trimester bleeding 07/19/2014  . No prenatal care in current pregnancy in third trimester 07/19/2014  . Twin gestation in third trimester   . [redacted] weeks gestation of pregnancy   . Bleeding     PLAN: Inpatient care til 7 days without bleeding then outpt care.  Harout Scheurich V 07/21/2014,7:20 AM

## 2014-07-22 NOTE — Progress Notes (Signed)
Acknowledged order for CSW consult regarding mother's positive UDS.  Patient was seen by social work on 07/19/14.  Patient admits to use of marijuana about two weeks ago.  She denies chronic use or any need for treatment.

## 2014-07-22 NOTE — Progress Notes (Signed)
A drop noted in the toilet but none noted on toilet paper per pt]

## 2014-07-22 NOTE — Progress Notes (Signed)
Patient ID: Victoria Mclaughlin Hussar, female   DOB: 12/24/1985, 28 y.o.   MRN: [id number] FACULTY PRACTICE ANTEPARTUM(COMPREHENSIVE) NOTE  Victoria Mclaughlin Doolittle is Mclaughlin 28 y.o. [id number] at 453w6d by third trimester u/s who is admitted for vaginal bleeding.   Fetal presentation is cephalic, transverse Length of Stay:  3  Days  Subjective: No further bleeding, noted dark red blood yesterday Patient reports the fetal movement as active. Patient reports uterine contraction  activity as irregular, every 10 minutes. Patient reports  vaginal bleeding as none. Patient describes fluid per vagina as None.  Vitals:  Blood pressure 128/81, pulse 93, temperature 98.3 F (36.8 C), temperature source Oral, resp. rate 20, height 5\' 4"  (1.626 m), weight 220 lb (99.791 kg), last menstrual period 11/20/2013. Physical Examination:  General appearance - alert, well appearing, and in no distress Abdomen - gravid, NT Fundal Height:  size greater than dates Extremities: Homans sign is negative, no sign of DVT  Membranes:intact  Fetal Monitoring: Mclaughlin: Baseline: 145 bpm, Variability: Good {> 6 bpm), Accelerations: Reactive and Decelerations: Absent  B: Baseline:140 bpm, Variability: Good {>6 bpm), Accelerations: Reactive and Decelerations: absent  Labs:  Results for orders placed or performed during the hospital encounter of 07/19/14 (from the past 24 hour(s))  Glucose, 2 hour gestational   Collection Time: 07/21/14  7:45 AM  Result Value Ref Range   Glucose, 2 Hour-Gestational 146 70 - 164 mg/dL  Glucose, 3 hour gestational   Collection Time: 07/21/14  8:52 AM  Result Value Ref Range   Glucose, GTT - 3 Hour 124 70 - 144 mg/dL    Medications:  Scheduled . docusate sodium  100 mg Oral Daily  . ferrous sulfate  325 mg Oral BID WC  . prenatal multivitamin  1 tablet Oral Q1200  . sodium chloride  3 mL Intravenous Q12H   I have reviewed the patient's current medications.  ASSESSMENT: Patient Active Problem List   Diagnosis Date Noted  . Third trimester bleeding 07/19/2014  . No prenatal care in current pregnancy in third trimester 07/19/2014  . Twin gestation in third trimester     PLAN: Inpatient monitoring 7 days post bleeding.  Has some issues with getting her kids to school.  Will have SW or care manager come by with ideas.  Reva BoresPRATT,TANYA S, MD 07/22/2014,7:27 AM

## 2014-07-22 NOTE — Plan of Care (Signed)
Problem: Consults Goal: Birthing Suites Patient Information Press F2 to bring up selections list   Pt < [redacted] weeks EGA Goal: Diabetes Guidelines per MD order/protocol Outcome: Completed/Met Date Met:  07/22/14 Glucola and 3 hour GTT performed

## 2014-07-23 DIAGNOSIS — O0933 Supervision of pregnancy with insufficient antenatal care, third trimester: Secondary | ICD-10-CM

## 2014-07-23 DIAGNOSIS — O30003 Twin pregnancy, unspecified number of placenta and unspecified number of amniotic sacs, third trimester: Secondary | ICD-10-CM

## 2014-07-23 LAB — TYPE AND SCREEN
ABO/RH(D): B POS
ANTIBODY SCREEN: POSITIVE
DAT, IgG: NEGATIVE
UNIT DIVISION: 0
Unit division: 0

## 2014-07-23 NOTE — Plan of Care (Signed)
Problem: Consults Goal: Neonatologist Consult Outcome: Progressing Pt. Seen by NICU, no tour yet.  Problem: Phase I Progression Outcomes Goal: No significant worsening bleeding/cervix change/vag drainage No significant worsening in vaginal bleeding, cervical change, or vaginal drainage.  Outcome: Progressing Pt. Without bleeding, vag. drng. Goal: Contractions < 5-6/hour Outcome: Progressing Monitoring done QS for 30 minutes 5 contracions last evening with ut. Irritability. Goal: Maintains reassuring Fetal Heart Rate Outcome: Progressing FHR monitoring done QS for 30 minutes, FHR reassuring last evening while monitored. Goal: Pain controlled with appropriate interventions Outcome: Progressing Pt. Without c/o pain. Goal: OOB as tolerated unless otherwise ordered Outcome: Progressing Pt. With BRP and shower privileges.  Problem: Phase II Progression Outcomes Goal: Electronic fetal monitoring(Doppler,Continuous,Intermittent) EFM (Doppler, Continuous, Intermittent)  Outcome: Progressing FHR monitoring QS for 30 minutes. Goal: Tolerating diet Outcome: Progressing Pt. Tol. A reg. Diet. Goal: Mattress in use Mattress in use Nurse, adult( [employer], Med/Surg, Regular, Birthing Suite, Other)  Outcome: Progressing Regular.

## 2014-07-23 NOTE — Plan of Care (Signed)
Problem: Discharge Progression Outcomes Goal: Social Services Referral Outcome: Progressing Social work in to see pt

## 2014-07-23 NOTE — Progress Notes (Signed)
[facility] nurse for emotional and spiritual support.  She reported that she had a very good and helpful visit with CSW.  She misses her children and is hoping that they can come up to visit today.  Our visit was interrupted by a phone call, but we will continue to reach out when we are able.  Please page as needs arise.  [address]Chaplain Katy Bemisslaussen Pager, [phone] 3:49 PM    07/23/14 1500  Clinical Encounter Type  Visited With Patient  Visit Type Initial  Referral From Nurse

## 2014-07-23 NOTE — Progress Notes (Signed)
Patient ID: Victoria CarrelSharine A Hippe, female   DOB: 05/15/1986, 28 y.o.   MRN: [id number] FACULTY PRACTICE ANTEPARTUM(COMPREHENSIVE) NOTE  Victoria Mclaughlin is a 28 y.o. [id number] at 8731w0d by third trimester u/s who is admitted for vaginal bleeding.   Fetal presentation is cephalic, transverse Length of Stay:  4  Days  Subjective: Patient denies any episodes of vaginal bleeding Patient reports the fetal movement as active. Patient reports uterine contraction  activity as irregular, every 10 minutes. Patient reports  vaginal bleeding as none. Patient describes fluid per vagina as None.  Vitals:  Blood pressure 127/81, pulse 99, temperature 99.1 F (37.3 C), temperature source Oral, resp. rate 20, height 5\' 4"  (1.626 m), weight 220 lb (99.791 kg), last menstrual period 11/20/2013, SpO2 98 %. Physical Examination:  General appearance - alert, well appearing, and in no distress Abdomen - gravid, NT Fundal Height:  size greater than dates Extremities: Homans sign is negative, no sign of DVT  Membranes:intact  Fetal Monitoring: A: Baseline: 125 bpm, Variability: Good {> 6 bpm), Accelerations: Reactive and Decelerations: Absent  B: Baseline:130 bpm, Variability: Good {>6 bpm), Accelerations: Reactive and Decelerations: absent Toco: uterine irritability  Labs:  Results for orders placed or performed during the hospital encounter of 07/19/14 (from the past 24 hour(s))  Type and screen   Collection Time: 07/22/14  7:45 PM  Result Value Ref Range   ABO/RH(D) B POS    Antibody Screen POS    Sample Expiration 07/25/2014    Antibody Identification ANTI LEA (Lewis a)    DAT, IgG NEG    Unit Number [id number]    Blood Component Type RED CELLS,LR    Unit division 00    Status of Unit ALLOCATED    Transfusion Status OK TO TRANSFUSE    Crossmatch Result COMPATIBLE    Unit Number [id number]    Blood Component Type RED CELLS,LR    Unit division 00    Status of Unit ALLOCATED    Transfusion Status  OK TO TRANSFUSE    Crossmatch Result COMPATIBLE     Medications:  Scheduled . docusate sodium  100 mg Oral Daily  . ferrous sulfate  325 mg Oral BID WC  . prenatal multivitamin  1 tablet Oral Q1200  . sodium chloride  3 mL Intravenous Q12H   I have reviewed the patient's current medications.  ASSESSMENT: Patient Active Problem List   Diagnosis Date Noted  . Third trimester bleeding 07/19/2014  . No prenatal care in current pregnancy in third trimester 07/19/2014  . Twin gestation in third trimester     PLAN: Continue inpatient monitoring 7 days post bleeding- discharge planning on 12/17 Patient would like to be discharged today secondary to child care issues. Will have SW come and discuss options with her Continue current care.    Chace Bisch, MD 07/23/2014,6:33 AM

## 2014-07-24 MED ORDER — PSEUDOEPHEDRINE HCL 30 MG PO TABS
30.0000 mg | ORAL_TABLET | Freq: Four times a day (QID) | ORAL | Status: DC | PRN
  Administered 2014-07-24: 30 mg via ORAL
  Filled 2014-07-24: qty 1

## 2014-07-24 MED ORDER — SALINE SPRAY 0.65 % NA SOLN
1.0000 | NASAL | Status: DC | PRN
  Administered 2014-07-24: 1 via NASAL
  Filled 2014-07-24: qty 44

## 2014-07-24 NOTE — Progress Notes (Signed)
Patient c/o neck and back soreness.  We will set her up with a K-pad.

## 2014-07-24 NOTE — Progress Notes (Addendum)
Patient doing well. No needs at this time.

## 2014-07-24 NOTE — Progress Notes (Signed)
CSW received call to meet with patient due to childcare concerns and possibility that patient was planning to leave AMA.  CSW met with patient who states she misses her children and needs to get home.  CSW validated patient's feelings and helped patient process her feelings regarding being on bed rest in the hospital.  CSW helped patient come up with a plan to stay in the hospital a few more days by providing her with bus passes in order for her boyfriend to be able to visit with her 2 children.  Patient states she also needs to get to [facility] in order to re-certify for food stamps.  CSW offered to fax or mail her re-certification for her if FOB is able to bring it to the hospital.  She states she has it with her and gave it to CSW to mail.  She states they are low on food in the home at this time.  CSW went to the food pantry at [place] and provided patient with 3 full bags of non-perishable groceries to give her family.  Patient was so thankful that she hugged CSW and began to cry.  CSW asked her to let CSW know if there is anything else she can think of that CSW can do to help her remain in the hospital until doctors think it is safe for her to discharge.  She agreed.

## 2014-07-24 NOTE — Progress Notes (Signed)
Babies are very active at this time good fetal movement palpated and seen.

## 2014-07-24 NOTE — Progress Notes (Signed)
UR completed 

## 2014-07-24 NOTE — Progress Notes (Signed)
Patient ID: Victoria Mclaughlin, female   DOB: 11/16/1985, 28 y.o.   MRN: [id number] FACULTY PRACTICE ANTEPARTUM NOTE  Victoria CarrelSharine A Boutwell is a 28 y.o. [id number] at 4763w1d  who is admitted for vaginal bleeding.   Fetal presentation is Vtx/transverse. Length of Stay:  5  Days  Subjective:  Patient reports good fetal movement.  She reports no uterine contractions, no bleeding and no loss of fluid per vagina.  Vitals:  Blood pressure 117/68, pulse 104, temperature 98.6 F (37 C), temperature source Oral, resp. rate 20, height 5\' 4"  (1.626 m), weight 220 lb (99.791 kg), last menstrual period 11/20/2013, SpO2 98 %. Physical Examination:  General appearance - alert, well appearing, and in no distress Chest - clear to auscultation, no wheezes, rales or rhonchi, symmetric air entry Heart - normal rate, regular rhythm, normal S1, S2, no murmurs, rubs, clicks or gallops Abdomen - soft, nontender, nondistended, no masses or organomegaly Fundal Height:  size greater than dates Extremities: extremities normal, atraumatic, no cyanosis or edema  Membranes:intact  Fetal Monitoring:  Baseline: 130s/130s bpm, Variability: Good {> 6 bpm), Accelerations: Reactive and Decelerations: Absent  Labs:  No results found for this or any previous visit (from the past 24 hour(s)).  Imaging Studies:    No results found.   Medications:  Scheduled . docusate sodium  100 mg Oral Daily  . ferrous sulfate  325 mg Oral BID WC  . prenatal multivitamin  1 tablet Oral Q1200   I have reviewed the patient's current medications.  ASSESSMENT: Patient Active Problem List   Diagnosis Date Noted  . Third trimester bleeding 07/19/2014  . No prenatal care in current pregnancy in third trimester 07/19/2014  . Twin gestation in third trimester     PLAN: Continue motinoring babies.  Deliver for fetal/maternal reasons. Discharge with no bleeding x 7 days. Continue routine antenatal care.   Joshue Badal JEHIEL 07/24/2014,7:14  AM

## 2014-07-25 DIAGNOSIS — O4693 Antepartum hemorrhage, unspecified, third trimester: Principal | ICD-10-CM

## 2014-07-25 NOTE — Progress Notes (Signed)
Patient ID: Victoria Mclaughlin, female   DOB: 08/09/1986, 28 y.o.   MRN: [id number] FACULTY PRACTICE ANTEPARTUM NOTE  Victoria Mclaughlin is a 28 y.o. [id number] at 1237w2d  who is admitted for vaginal bleeding.   Fetal presentation is Vtx/transverse. Length of Stay:  6  Days  Subjective:  Patient reports good fetal movement.  She reports no uterine contractions, no bleeding and no loss of fluid per vagina.  Vitals:  Blood pressure 125/62, pulse 95, temperature 98.6 F (37 C), temperature source Oral, resp. rate 20, height 5\' 4"  (1.626 m), weight 220 lb (99.791 kg), last menstrual period 11/20/2013, SpO2 98 %. Physical Examination:  General appearance - alert, well appearing, and in no distress Chest - clear to auscultation, no wheezes, rales or rhonchi, symmetric air entry Heart - normal rate, regular rhythm, normal S1, S2, no murmurs, rubs, clicks or gallops Abdomen - soft, nontender, nondistended, no masses or organomegaly Fundal Height:  size greater than dates Extremities: extremities normal, atraumatic, no cyanosis or edema  Membranes:intact  Fetal Monitoring:  Baseline: 130s/130s bpm, Variability: Good {> 6 bpm), Accelerations: Reactive and Decelerations: Absent  Labs:  No results found for this or any previous visit (from the past 24 hour(s)).  Imaging Studies:    No results found.   Medications:  Scheduled . docusate sodium  100 mg Oral Daily  . ferrous sulfate  325 mg Oral BID WC  . prenatal multivitamin  1 tablet Oral Q1200   I have reviewed the patient's current medications.  ASSESSMENT: Patient Active Problem List   Diagnosis Date Noted  . Third trimester bleeding 07/19/2014  . No prenatal care in current pregnancy in third trimester 07/19/2014  . Twin gestation in third trimester     PLAN: Continue motinoring babies.  Deliver for fetal/maternal reasons. Discharge with no bleeding x 7 days; this will be tomorrow 07/25/14. Continue routine antenatal  care.   ANYANWU,UGONNA A, MD 07/25/2014,7:13 AM

## 2014-07-26 DIAGNOSIS — O4690 Antepartum hemorrhage, unspecified, unspecified trimester: Secondary | ICD-10-CM

## 2014-07-26 LAB — TYPE AND SCREEN
ABO/RH(D): B POS
ANTIBODY SCREEN: POSITIVE
DAT, IGG: NEGATIVE
UNIT DIVISION: 0
Unit division: 0

## 2014-07-26 NOTE — Progress Notes (Addendum)
CSW met with patient on her day of discharge to thank and commend her for following the plan we created on 07/23/14, and remaining in the hospital until medical team deemed her medically ready for discharge.  MOB was very appreciative and thanked CSW again for support and assistance this week.  CSW will check in with patient at delivery.  No barriers to discharge today.

## 2014-07-26 NOTE — Discharge Instructions (Signed)
°  Vaginal Bleeding During Pregnancy, Third Trimester °A small amount of bleeding (spotting) from the vagina is relatively common in pregnancy. Various things can cause bleeding or spotting in pregnancy. Sometimes the bleeding is normal and is not a problem. However, bleeding during the third trimester can also be a sign of something serious for the mother and the baby. Be sure to tell your health care provider about any vaginal bleeding right away.  °Some possible causes of vaginal bleeding during the third trimester include:  °· The placenta may be partially or completely covering the opening to the cervix (placenta previa).   °· The placenta may have separated from the uterus (abruption of the placenta).   °· There may be an infection or growth on the cervix.   °· You may be starting labor, called discharging of the mucus plug.   °· The placenta may grow into the muscle layer of the uterus (placenta accreta).   °HOME CARE INSTRUCTIONS  °Watch your condition for any changes. The following actions may help to lessen any discomfort you are feeling:  °· Follow your health care provider's instructions for limiting your activity. If your health care provider orders bed rest, you may need to stay in bed and only get up to use the bathroom. However, your health care provider may allow you to continue light activity. °· If needed, make plans for someone to help with your regular activities and responsibilities while you are on bed rest. °· Keep track of the number of pads you use each day, how often you change pads, and how soaked (saturated) they are. Write this down. °· Do not use tampons. Do not douche. °· Do not have sexual intercourse or orgasms until approved by your health care provider. °· Follow your health care provider's advice about lifting, driving, and physical activities. °· If you pass any tissue from your vagina, save the tissue so you can show it to your health care provider.   °· Only take  over-the-counter or prescription medicines as directed by your health care provider. °· Do not take aspirin because it can make you bleed.   °· Keep all follow-up appointments as directed by your health care provider. °SEEK MEDICAL CARE IF: °· You have any vaginal bleeding during any part of your pregnancy. °· You have cramps or labor pains. °· You have a fever, not controlled by medicine. °SEEK IMMEDIATE MEDICAL CARE IF:  °· You have severe cramps or pain in your back or belly (abdomen). °· You have chills. °· You have a gush of fluid from the vagina. °· You pass large clots or tissue from your vagina. °· Your bleeding increases. °· You feel light-headed or weak. °· You pass out. °· You feel less movement or no movement of the baby.   °MAKE SURE YOU: °· Understand these instructions. °· Will watch your condition. °· Will get help right away if you are not doing well or get worse. °Document Released: 10/17/2002 Document Revised: 08/01/2013 Document Reviewed: 04/03/2013 °ExitCare® Patient Information ©2015 [employer]. This information is not intended to replace advice given to you by your health care provider. Make sure you discuss any questions you have with your health care provider. ° ° °

## 2014-07-26 NOTE — Discharge Summary (Addendum)
Physician Discharge Summary  Patient ID: Victoria CarrelSharine A Ancona MRN: [id number] DOB/AGE: 26/08/1985 26 y.o.  Admit date: 07/19/2014 Discharge date: 07/26/2014  Admission Diagnoses: third trimester vaginal bleeding with twin gestation  Discharge Diagnoses: same Principal Problem:   Third trimester bleeding Active Problems:   Twin gestation in third trimester   No prenatal care in current pregnancy in third trimester   Discharged Condition: good  Hospital Course: 28 yo [id number] at 3750w3d admitted secondary to vaginal bleeding in third trimester. Patient did not receive any prenatal care thus far and discovered during this admission that she is expected twins. She was placed on observation for 7 days over the course of which her bleeding completely stopped. She was found stable for discharge with [facility] Discharge precautions provided  Consults: None  Significant Diagnostic Studies:   Treatments: bedrest  Discharge Exam: Blood pressure 128/69, pulse 95, temperature 98.6 F (37 C), temperature source Oral, resp. rate 18, height 5\' 4"  (1.626 m), weight 221 lb (100.245 kg), last menstrual period 11/20/2013, SpO2 98 %. General appearance: alert, cooperative and no distress Resp: clear to auscultation bilaterally Cardio: regular rate and rhythm GI: soft, gravid, non tender Extremities: no edema, redness or tenderness in the calves or thighs and 2+DTR FHT: baseline 125/130, mod variability, +accels, no decels Toco: uterine irritability  Disposition: home     Medication List    TAKE these medications        prenatal multivitamin Tabs tablet  Take 1 tablet by mouth daily at 12 noon.           Follow-up Information    [facility]   Specialty:  Obstetrics and Gynecology   Why:  an appointment will be made for you to follow up early next week   Contact information:   [address] [place]  [id number] [phone]      Signed: Reeda Soohoo 07/26/2014, 9:22 AM

## 2014-07-28 LAB — TYPE AND SCREEN
ABO/RH(D): B POS
ANTIBODY SCREEN: POSITIVE
DAT, IgG: NEGATIVE
UNIT DIVISION: 0
Unit division: 0

## 2014-08-01 ENCOUNTER — Encounter: Payer: Self-pay | Admitting: Obstetrics & Gynecology

## 2014-08-08 ENCOUNTER — Inpatient Hospital Stay (HOSPITAL_COMMUNITY): Payer: Medicaid Other

## 2014-08-08 ENCOUNTER — Encounter: Payer: Self-pay | Admitting: Physician Assistant

## 2014-08-08 ENCOUNTER — Inpatient Hospital Stay (HOSPITAL_COMMUNITY)
Admission: AD | Admit: 2014-08-08 | Discharge: 2014-08-11 | DRG: 765 | Disposition: A | Discharge status: Home / Self Care | Payer: Medicaid Other | Source: Ambulatory Visit | Attending: Obstetrics & Gynecology | Admitting: Obstetrics & Gynecology

## 2014-08-08 ENCOUNTER — Encounter (HOSPITAL_COMMUNITY): Payer: Self-pay | Admitting: *Deleted

## 2014-08-08 ENCOUNTER — Ambulatory Visit (HOSPITAL_COMMUNITY): Admission: RE | Admit: 2014-08-08 | Payer: MEDICAID | Source: Ambulatory Visit

## 2014-08-08 ENCOUNTER — Inpatient Hospital Stay (HOSPITAL_COMMUNITY): Payer: Medicaid Other | Admitting: Anesthesiology

## 2014-08-08 ENCOUNTER — Ambulatory Visit (INDEPENDENT_AMBULATORY_CARE_PROVIDER_SITE_OTHER): Payer: Self-pay | Admitting: Physician Assistant

## 2014-08-08 ENCOUNTER — Encounter (HOSPITAL_COMMUNITY)
Admission: AD | Disposition: A | Discharge status: Home / Self Care | Payer: Self-pay | Source: Ambulatory Visit | Attending: Obstetrics & Gynecology

## 2014-08-08 VITALS — Temp 97.7°F | Wt 213.2 lb

## 2014-08-08 DIAGNOSIS — F1290 Cannabis use, unspecified, uncomplicated: Secondary | ICD-10-CM | POA: Diagnosis present

## 2014-08-08 DIAGNOSIS — Z3A37 37 weeks gestation of pregnancy: Secondary | ICD-10-CM | POA: Diagnosis present

## 2014-08-08 DIAGNOSIS — O30003 Twin pregnancy, unspecified number of placenta and unspecified number of amniotic sacs, third trimester: Secondary | ICD-10-CM | POA: Diagnosis present

## 2014-08-08 DIAGNOSIS — O99323 Drug use complicating pregnancy, third trimester: Secondary | ICD-10-CM | POA: Diagnosis present

## 2014-08-08 DIAGNOSIS — F22 Delusional disorders: Secondary | ICD-10-CM

## 2014-08-08 DIAGNOSIS — O0933 Supervision of pregnancy with insufficient antenatal care, third trimester: Secondary | ICD-10-CM | POA: Diagnosis not present

## 2014-08-08 DIAGNOSIS — T81509D Unspecified complication of foreign body accidentally left in body following unspecified procedure, subsequent encounter: Secondary | ICD-10-CM

## 2014-08-08 DIAGNOSIS — R8290 Unspecified abnormal findings in urine: Secondary | ICD-10-CM

## 2014-08-08 DIAGNOSIS — Z98891 History of uterine scar from previous surgery: Secondary | ICD-10-CM

## 2014-08-08 DIAGNOSIS — O321XX2 Maternal care for breech presentation, fetus 2: Secondary | ICD-10-CM

## 2014-08-08 HISTORY — DX: Unspecified asthma, uncomplicated: J45.909

## 2014-08-08 LAB — POCT URINALYSIS DIP (DEVICE)
Bilirubin Urine: NEGATIVE
Bilirubin Urine: NEGATIVE
Glucose, UA: NEGATIVE mg/dL
Glucose, UA: NEGATIVE mg/dL
HGB URINE DIPSTICK: NEGATIVE
Hgb urine dipstick: NEGATIVE
KETONES UR: NEGATIVE mg/dL
Ketones, ur: NEGATIVE mg/dL
Nitrite: NEGATIVE
Nitrite: NEGATIVE
PH: 7 (ref 5.0–8.0)
Protein, ur: NEGATIVE mg/dL
Protein, ur: NEGATIVE mg/dL
Specific Gravity, Urine: 1.01 (ref 1.005–1.030)
Specific Gravity, Urine: 1.015 (ref 1.005–1.030)
Urobilinogen, UA: 0.2 mg/dL (ref 0.0–1.0)
Urobilinogen, UA: 1 mg/dL (ref 0.0–1.0)
pH: 6.5 (ref 5.0–8.0)

## 2014-08-08 LAB — POCT FERN TEST: POCT FERN TEST: POSITIVE

## 2014-08-08 SURGERY — Surgical Case
Anesthesia: General | Site: Abdomen

## 2014-08-08 MED ORDER — MIDAZOLAM HCL 5 MG/5ML IJ SOLN
INTRAMUSCULAR | Status: DC | PRN
  Administered 2014-08-08 (×2): 1 mg via INTRAVENOUS

## 2014-08-08 MED ORDER — SIMETHICONE 80 MG PO CHEW
80.0000 mg | CHEWABLE_TABLET | Freq: Three times a day (TID) | ORAL | Status: DC
  Administered 2014-08-09 – 2014-08-10 (×4): 80 mg via ORAL
  Filled 2014-08-08 (×4): qty 1

## 2014-08-08 MED ORDER — SUCCINYLCHOLINE CHLORIDE 20 MG/ML IJ SOLN
INTRAMUSCULAR | Status: AC
  Filled 2014-08-08: qty 10

## 2014-08-08 MED ORDER — IBUPROFEN 600 MG PO TABS
600.0000 mg | ORAL_TABLET | Freq: Four times a day (QID) | ORAL | Status: DC
  Filled 2014-08-08: qty 1

## 2014-08-08 MED ORDER — OXYTOCIN 40 UNITS IN LACTATED RINGERS INFUSION - SIMPLE MED
INTRAVENOUS | Status: AC
  Filled 2014-08-08: qty 1000

## 2014-08-08 MED ORDER — MORPHINE SULFATE (PF) 1 MG/ML IV SOLN
INTRAVENOUS | Status: DC
  Filled 2014-08-08: qty 25

## 2014-08-08 MED ORDER — HYDROMORPHONE HCL 1 MG/ML IJ SOLN
INTRAMUSCULAR | Status: AC
  Filled 2014-08-08: qty 1

## 2014-08-08 MED ORDER — OXYTOCIN 40 UNITS IN LACTATED RINGERS INFUSION - SIMPLE MED: 62.5000 mL/h | INTRAVENOUS | Status: AC

## 2014-08-08 MED ORDER — OXYCODONE-ACETAMINOPHEN 5-325 MG PO TABS
2.0000 | ORAL_TABLET | ORAL | Status: DC | PRN
  Administered 2014-08-09 – 2014-08-11 (×4): 2 via ORAL
  Filled 2014-08-08 (×5): qty 2

## 2014-08-08 MED ORDER — ONDANSETRON HCL 4 MG/2ML IJ SOLN: 4.0000 mg | Freq: Four times a day (QID) | INTRAMUSCULAR | Status: DC | PRN

## 2014-08-08 MED ORDER — FENTANYL CITRATE 0.05 MG/ML IJ SOLN
100.0000 ug | Freq: Once | INTRAMUSCULAR | Status: AC
  Administered 2014-08-08: 100 ug via INTRAVENOUS
  Filled 2014-08-08: qty 2

## 2014-08-08 MED ORDER — HYDROMORPHONE HCL 1 MG/ML IJ SOLN
INTRAMUSCULAR | Status: DC | PRN
  Administered 2014-08-08: 1 mg via INTRAVENOUS

## 2014-08-08 MED ORDER — PHENYLEPHRINE HCL 10 MG/ML IJ SOLN
INTRAMUSCULAR | Status: DC | PRN
  Administered 2014-08-08 (×4): 80 ug via INTRAVENOUS

## 2014-08-08 MED ORDER — WITCH HAZEL-GLYCERIN EX PADS: 1.0000 "application " | MEDICATED_PAD | CUTANEOUS | Status: DC | PRN

## 2014-08-08 MED ORDER — OXYTOCIN 10 UNIT/ML IJ SOLN
40.0000 [IU] | INTRAVENOUS | Status: DC | PRN
  Administered 2014-08-08: 40 [IU] via INTRAVENOUS

## 2014-08-08 MED ORDER — HYDROMORPHONE HCL 1 MG/ML IJ SOLN: 0.2500 mg | INTRAMUSCULAR | Status: DC | PRN

## 2014-08-08 MED ORDER — DIPHENHYDRAMINE HCL 25 MG PO CAPS
25.0000 mg | ORAL_CAPSULE | Freq: Four times a day (QID) | ORAL | Status: DC | PRN
Start: 2014-08-08 — End: 2014-08-11

## 2014-08-08 MED ORDER — PHENYLEPHRINE 8 MG IN D5W 100 ML (0.08MG/ML) PREMIX OPTIME
INJECTION | INTRAVENOUS | Status: DC | PRN
  Administered 2014-08-08: 120 ug/min via INTRAVENOUS

## 2014-08-08 MED ORDER — FENTANYL CITRATE 0.05 MG/ML IJ SOLN
INTRAMUSCULAR | Status: AC
  Filled 2014-08-08: qty 5

## 2014-08-08 MED ORDER — OXYTOCIN 10 UNIT/ML IJ SOLN
INTRAMUSCULAR | Status: AC
  Filled 2014-08-08: qty 4

## 2014-08-08 MED ORDER — ONDANSETRON HCL 4 MG PO TABS: 4.0000 mg | ORAL_TABLET | ORAL | Status: DC | PRN

## 2014-08-08 MED ORDER — SENNOSIDES-DOCUSATE SODIUM 8.6-50 MG PO TABS
2.0000 | ORAL_TABLET | ORAL | Status: DC
  Administered 2014-08-09 – 2014-08-10 (×3): 2 via ORAL
  Filled 2014-08-08 (×3): qty 2

## 2014-08-08 MED ORDER — MENTHOL 3 MG MT LOZG: 1.0000 | LOZENGE | OROMUCOSAL | Status: DC | PRN

## 2014-08-08 MED ORDER — PROPOFOL 10 MG/ML IV BOLUS
INTRAVENOUS | Status: DC | PRN
  Administered 2014-08-08: 200 mg via INTRAVENOUS

## 2014-08-08 MED ORDER — PHENYLEPHRINE 40 MCG/ML (10ML) SYRINGE FOR IV PUSH (FOR BLOOD PRESSURE SUPPORT)
PREFILLED_SYRINGE | INTRAVENOUS | Status: AC
  Filled 2014-08-08: qty 20

## 2014-08-08 MED ORDER — ACETAMINOPHEN 10 MG/ML IV SOLN
1000.0000 mg | Freq: Once | INTRAVENOUS | Status: AC
  Administered 2014-08-08: 1000 mg via INTRAVENOUS
  Filled 2014-08-08: qty 100

## 2014-08-08 MED ORDER — PROPOFOL 10 MG/ML IV EMUL
INTRAVENOUS | Status: AC
  Filled 2014-08-08: qty 20

## 2014-08-08 MED ORDER — SIMETHICONE 80 MG PO CHEW
80.0000 mg | CHEWABLE_TABLET | ORAL | Status: DC
  Administered 2014-08-09 – 2014-08-10 (×3): 80 mg via ORAL
  Filled 2014-08-08 (×3): qty 1

## 2014-08-08 MED ORDER — LACTATED RINGERS IV SOLN
INTRAVENOUS | Status: DC
  Administered 2014-08-09: 1 mL via INTRAVENOUS

## 2014-08-08 MED ORDER — EPHEDRINE 5 MG/ML INJ
INTRAVENOUS | Status: AC
  Filled 2014-08-08: qty 10

## 2014-08-08 MED ORDER — PROPOFOL 10 MG/ML IV EMUL
INTRAVENOUS | Status: AC
  Filled 2014-08-08: qty 50

## 2014-08-08 MED ORDER — LIDOCAINE HCL (CARDIAC) 20 MG/ML IV SOLN
INTRAVENOUS | Status: AC
  Filled 2014-08-08: qty 5

## 2014-08-08 MED ORDER — TETANUS-DIPHTH-ACELL PERTUSSIS 5-2.5-18.5 LF-MCG/0.5 IM SUSP
0.5000 mL | Freq: Once | INTRAMUSCULAR | Status: AC
  Administered 2014-08-09: 0.5 mL via INTRAMUSCULAR
  Filled 2014-08-08: qty 0.5

## 2014-08-08 MED ORDER — GENTAMICIN SULFATE 40 MG/ML IJ SOLN
Freq: Once | INTRAMUSCULAR | Status: AC
  Administered 2014-08-08: 100 mL via INTRAVENOUS
  Filled 2014-08-08: qty 9

## 2014-08-08 MED ORDER — ZOLPIDEM TARTRATE 5 MG PO TABS: 5.0000 mg | ORAL_TABLET | Freq: Every evening | ORAL | Status: DC | PRN

## 2014-08-08 MED ORDER — ACETAMINOPHEN 160 MG/5ML PO SOLN: 325.0000 mg | ORAL | Status: DC | PRN

## 2014-08-08 MED ORDER — MIDAZOLAM HCL 2 MG/2ML IJ SOLN
INTRAMUSCULAR | Status: AC
  Filled 2014-08-08: qty 2

## 2014-08-08 MED ORDER — SCOPOLAMINE 1 MG/3DAYS TD PT72
MEDICATED_PATCH | TRANSDERMAL | Status: AC
  Filled 2014-08-08: qty 1

## 2014-08-08 MED ORDER — SODIUM CHLORIDE 0.9 % IJ SOLN: 9.0000 mL | INTRAMUSCULAR | Status: DC | PRN

## 2014-08-08 MED ORDER — DEXAMETHASONE SODIUM PHOSPHATE 10 MG/ML IJ SOLN
INTRAMUSCULAR | Status: DC | PRN
  Administered 2014-08-08: 4 mg via INTRAVENOUS

## 2014-08-08 MED ORDER — LANOLIN HYDROUS EX OINT: 1.0000 "application " | TOPICAL_OINTMENT | CUTANEOUS | Status: DC | PRN

## 2014-08-08 MED ORDER — DEXAMETHASONE SODIUM PHOSPHATE 4 MG/ML IJ SOLN
INTRAMUSCULAR | Status: AC
  Filled 2014-08-08: qty 1

## 2014-08-08 MED ORDER — PHENYLEPHRINE 8 MG IN D5W 100 ML (0.08MG/ML) PREMIX OPTIME
INJECTION | INTRAVENOUS | Status: AC
  Filled 2014-08-08: qty 200

## 2014-08-08 MED ORDER — NALOXONE HCL 0.4 MG/ML IJ SOLN: 0.4000 mg | INTRAMUSCULAR | Status: DC | PRN

## 2014-08-08 MED ORDER — LACTATED RINGERS IV SOLN
INTRAVENOUS | Status: DC | PRN
  Administered 2014-08-08: 21:00:00 via INTRAVENOUS

## 2014-08-08 MED ORDER — OXYCODONE-ACETAMINOPHEN 5-325 MG PO TABS
1.0000 | ORAL_TABLET | ORAL | Status: DC | PRN
  Administered 2014-08-10 (×3): 1 via ORAL
  Filled 2014-08-08 (×3): qty 1

## 2014-08-08 MED ORDER — PRENATAL MULTIVITAMIN CH
1.0000 | ORAL_TABLET | Freq: Every day | ORAL | Status: DC
  Administered 2014-08-09 – 2014-08-10 (×2): 1 via ORAL
  Filled 2014-08-08 (×2): qty 1

## 2014-08-08 MED ORDER — BUPIVACAINE HCL (PF) 0.5 % IJ SOLN
INTRAMUSCULAR | Status: AC
  Filled 2014-08-08: qty 30

## 2014-08-08 MED ORDER — ONDANSETRON HCL 4 MG/2ML IJ SOLN
INTRAMUSCULAR | Status: DC | PRN
  Administered 2014-08-08: 4 mg via INTRAVENOUS

## 2014-08-08 MED ORDER — ONDANSETRON HCL 4 MG/2ML IJ SOLN: 4.0000 mg | Freq: Once | INTRAMUSCULAR | Status: DC | PRN

## 2014-08-08 MED ORDER — MEASLES, MUMPS & RUBELLA VAC ~~LOC~~ INJ
0.5000 mL | INJECTION | Freq: Once | SUBCUTANEOUS | Status: DC
  Filled 2014-08-08: qty 0.5

## 2014-08-08 MED ORDER — DIPHENHYDRAMINE HCL 50 MG/ML IJ SOLN: 12.5000 mg | Freq: Four times a day (QID) | INTRAMUSCULAR | Status: DC | PRN

## 2014-08-08 MED ORDER — SCOPOLAMINE 1 MG/3DAYS TD PT72
MEDICATED_PATCH | TRANSDERMAL | Status: DC | PRN
  Administered 2014-08-08: 1 via TRANSDERMAL

## 2014-08-08 MED ORDER — SUCCINYLCHOLINE CHLORIDE 20 MG/ML IJ SOLN
INTRAMUSCULAR | Status: DC | PRN
  Administered 2014-08-08: 120 mg via INTRAVENOUS

## 2014-08-08 MED ORDER — FENTANYL CITRATE 0.05 MG/ML IJ SOLN
INTRAMUSCULAR | Status: DC | PRN
  Administered 2014-08-08: 50 ug via INTRAVENOUS
  Administered 2014-08-08: 100 ug via INTRAVENOUS
  Administered 2014-08-08 (×2): 50 ug via INTRAVENOUS

## 2014-08-08 MED ORDER — DIBUCAINE 1 % RE OINT: 1.0000 "application " | TOPICAL_OINTMENT | RECTAL | Status: DC | PRN

## 2014-08-08 MED ORDER — DIPHENHYDRAMINE HCL 12.5 MG/5ML PO ELIX
12.5000 mg | ORAL_SOLUTION | Freq: Four times a day (QID) | ORAL | Status: DC | PRN
  Filled 2014-08-08: qty 5

## 2014-08-08 MED ORDER — ACETAMINOPHEN 325 MG PO TABS: 325.0000 mg | ORAL_TABLET | ORAL | Status: DC | PRN

## 2014-08-08 MED ORDER — LACTATED RINGERS IV SOLN
INTRAVENOUS | Status: DC
  Administered 2014-08-08 (×5): via INTRAVENOUS

## 2014-08-08 MED ORDER — ONDANSETRON HCL 4 MG/2ML IJ SOLN
4.0000 mg | INTRAMUSCULAR | Status: DC | PRN
  Administered 2014-08-09: 4 mg via INTRAVENOUS
  Filled 2014-08-08: qty 2

## 2014-08-08 MED ORDER — SIMETHICONE 80 MG PO CHEW: 80.0000 mg | CHEWABLE_TABLET | ORAL | Status: DC | PRN

## 2014-08-08 SURGICAL SUPPLY — 43 items
APL SKNCLS STERI-STRIP NONHPOA (GAUZE/BANDAGES/DRESSINGS) ×1
BENZOIN TINCTURE PRP APPL 2/3 (GAUZE/BANDAGES/DRESSINGS) ×3 IMPLANT
BINDER ABD UNIV 10 28-50 (GAUZE/BANDAGES/DRESSINGS) ×1 IMPLANT
BINDER ABD UNIV 12 45-62 (WOUND CARE) IMPLANT
BINDER ABDOM UNIV 10 (GAUZE/BANDAGES/DRESSINGS) ×3
BINDER ABDOMINAL 46IN 62IN (WOUND CARE)
CLAMP CORD UMBIL (MISCELLANEOUS) IMPLANT
CLOSURE WOUND 1/2 X4 (GAUZE/BANDAGES/DRESSINGS) ×1
CLOTH BEACON ORANGE TIMEOUT ST (SAFETY) ×3 IMPLANT
DRAPE SHEET LG 3/4 BI-LAMINATE (DRAPES) IMPLANT
DRSG OPSITE POSTOP 4X10 (GAUZE/BANDAGES/DRESSINGS) ×3 IMPLANT
DURAPREP 26ML APPLICATOR (WOUND CARE) ×3 IMPLANT
ELECT REM PT RETURN 9FT ADLT (ELECTROSURGICAL) ×3
ELECTRODE REM PT RTRN 9FT ADLT (ELECTROSURGICAL) ×1 IMPLANT
EXTRACTOR VACUUM KIWI (MISCELLANEOUS) IMPLANT
GLOVE BIO SURGEON STRL SZ7 (GLOVE) ×3 IMPLANT
GLOVE BIOGEL PI IND STRL 7.0 (GLOVE) ×1 IMPLANT
GLOVE BIOGEL PI INDICATOR 7.0 (GLOVE) ×2
GOWN STRL REUS W/TWL LRG LVL3 (GOWN DISPOSABLE) ×6 IMPLANT
KIT ABG SYR 3ML LUER SLIP (SYRINGE) IMPLANT
NDL HYPO 25X5/8 SAFETYGLIDE (NEEDLE) IMPLANT
NEEDLE HYPO 22GX1.5 SAFETY (NEEDLE) ×3 IMPLANT
NEEDLE HYPO 25X5/8 SAFETYGLIDE (NEEDLE) IMPLANT
NS IRRIG 1000ML POUR BTL (IV SOLUTION) ×3 IMPLANT
PACK C SECTION WH (CUSTOM PROCEDURE TRAY) ×3 IMPLANT
PAD OB MATERNITY 4.3X12.25 (PERSONAL CARE ITEMS) ×3 IMPLANT
RTRCTR C-SECT PINK 25CM LRG (MISCELLANEOUS) IMPLANT
SLEEVE SCD COMPRESS KNEE MED (MISCELLANEOUS) ×2 IMPLANT
SPONGE LAP 18X18 X RAY DECT (DISPOSABLE) ×2 IMPLANT
SPONGE SURGIFOAM ABS GEL 12-7 (HEMOSTASIS) IMPLANT
STAPLER VISISTAT 35W (STAPLE) IMPLANT
STRIP CLOSURE SKIN 1/2X4 (GAUZE/BANDAGES/DRESSINGS) ×2 IMPLANT
SUT PDS AB 0 CTX 60 (SUTURE) IMPLANT
SUT PLAIN 0 NONE (SUTURE) IMPLANT
SUT SILK 0 TIES 10X30 (SUTURE) IMPLANT
SUT VIC AB 0 CT1 36 (SUTURE) ×9 IMPLANT
SUT VIC AB 3-0 CT1 27 (SUTURE) ×3
SUT VIC AB 3-0 CT1 TAPERPNT 27 (SUTURE) ×1 IMPLANT
SUT VIC AB 4-0 KS 27 (SUTURE) IMPLANT
SYR CONTROL 10ML LL (SYRINGE) ×3 IMPLANT
TOWEL OR 17X24 6PK STRL BLUE (TOWEL DISPOSABLE) ×3 IMPLANT
TRAY FOLEY CATH 14FR (SET/KITS/TRAYS/PACK) ×1 IMPLANT
WATER STERILE IRR 1000ML POUR (IV SOLUTION) ×3 IMPLANT

## 2014-08-08 NOTE — Anesthesia Preprocedure Evaluation (Addendum)
Anesthesia Evaluation  Patient identified by MRN, date of birth, ID band Patient awake    Reviewed: Allergy & Precautions, H&P , NPO status , Patient's Chart, lab work & pertinent test results  History of Anesthesia Complications Negative for: history of anesthetic complications  Airway Mallampati: II  TM Distance: >3 FB Neck ROM: Full    Dental no notable dental hx. (+) Dental Advisory Given   Pulmonary asthma ,  breath sounds clear to auscultation  Pulmonary exam normal       Cardiovascular Exercise Tolerance: Good negative cardio ROS  Rhythm:Regular Rate:Normal     Neuro/Psych negative neurological ROS  negative psych ROS   GI/Hepatic negative GI ROS, Neg liver ROS,   Endo/Other  negative endocrine ROS  Renal/GU negative Renal ROS  negative genitourinary   Musculoskeletal negative musculoskeletal ROS (+)   Abdominal   Peds negative pediatric ROS (+)  Hematology negative hematology ROS (+)   Anesthesia Other Findings   Reproductive/Obstetrics negative OB ROS                             Anesthesia Physical Anesthesia Plan  ASA: II and emergent  Anesthesia Plan: General   Post-op Pain Management:    Induction: Intravenous, Rapid sequence and Cricoid pressure planned  Airway Management Planned: Oral ETT  Additional Equipment:   Intra-op Plan:   Post-operative Plan:   Informed Consent: I have reviewed the patients History and Physical, chart, labs and discussed the procedure including the risks, benefits and alternatives for the proposed anesthesia with the patient or authorized representative who has indicated his/her understanding and acceptance.   Dental advisory given  Plan Discussed with:   Anesthesia Plan Comments:         Anesthesia Quick Evaluation

## 2014-08-08 NOTE — Progress Notes (Signed)
Reports pelvic pressure.  

## 2014-08-08 NOTE — MAU Note (Signed)
Dr. Loreta AveAcosta in to discuss delivery options with pt & her husband.

## 2014-08-08 NOTE — Anesthesia Postprocedure Evaluation (Signed)
  Anesthesia Post-op Note  Patient: Victoria Mclaughlin  Procedure(s) Performed: Procedure(s) (LRB): CESAREAN SECTION (N/A)  Patient Location: PACU  Anesthesia Type: General  Level of Consciousness: awake and alert   Airway and Oxygen Therapy: Patient Spontanous Breathing  Post-op Pain: mild  Post-op Assessment: Post-op Vital signs reviewed, Patient's Cardiovascular Status Stable, Respiratory Function Stable, Patent Airway and No signs of Nausea or vomiting  Last Vitals:  Filed Vitals:   08/08/14 2230  BP: 118/70  Pulse: 84  Temp:   Resp: 26    Post-op Vital Signs: stable   Complications: No apparent anesthesia complications

## 2014-08-08 NOTE — MAU Note (Signed)
Pt states she had gush of clear fluid around 1700 today, then started having uc's.  Denies bleeding.

## 2014-08-08 NOTE — Progress Notes (Signed)
Dr. Loreta AveAcosta informed about SROM, twin gestation, pt reports she spoke with Dr. Penne LashLeggett this morning about vaginal delivery vs C/S - Baby A is vertex, Baby B is breech.  MD states she will come to see pt.

## 2014-08-08 NOTE — Progress Notes (Signed)
IOL scheduled 08/14/14 @ 0700.  Pt expressed concern for vaginal delivery and had questions regarding having a scheduled C/S. Dr. Penne LashLeggett in to speak w/pt and significant other. She advised that pt is a good candidate for vaginal delivery and also requested fu growth US prior to delivery.  Dr. Penne LashLeggett also desires pt to have IOL on 08/13/14.  US for growth today @ 1345.  1520  Pt did not return to hospital for US @ 1345 today.  I notified Dr. Penne LashLeggett and she stated pt should have US on 1/4, then have direct admit for IOL. I rescheduled US to 1/4 @ 0730, IOL to follow and called pt in order to inform her of the plan of care. Pt's significant other answered and stated that she was on the way to the hospital for the scheduled US. I stated that her appt was @ 1345 and she will not be able to have the US today. An alternate plan has been made. I will call back tomorrow morning to discuss the plan with pt. He stated that he will give Dreya the message.

## 2014-08-08 NOTE — Progress Notes (Signed)
Patient sitting up on bed speaking with Dr. Gentry RochJudd, anesthesiologist, patient unable to lay back for monitoring

## 2014-08-08 NOTE — OR Nursing (Signed)
Patient was allergic to shellfish. It was stated during the timeout. Dr Erin FullingHarraway Smith wanted betadine poured for prep. At end of procedure no signs of allergic reaction noted.

## 2014-08-08 NOTE — OR Nursing (Signed)
Counts not done due to emergency case. Patient xrayed after case in the OR.

## 2014-08-08 NOTE — Brief Op Note (Signed)
08/08/2014  9:43 PM  PATIENT:  Dyanne CarrelSharine A Prindiville  28 y.o. female  PRE-OPERATIVE DIAGNOSIS: Twin A vaginal delivery.  Twin B: Breech, failed complete breech extraction; Cord Prolapse  POST-OPERATIVE DIAGNOSIS:  Breech, Cord Prolapse  PROCEDURE:  Procedure(s): CESAREAN SECTION (N/A)  SURGEON:  Surgeon(s) and Role:    * Willodean Rosenthalarolyn Harraway-Smith, MD - Primary  ANESTHESIA:   general  EBL:  Total I/O In: 2100 [I.V.:2000; IV Piggyback:100] Out: 800 [Urine:100; Blood:700]  BLOOD ADMINISTERED:none  DRAINS: none   LOCAL MEDICATIONS USED:  MARCAINE     SPECIMEN:  Source of Specimen:  placenta  DISPOSITION OF SPECIMEN:  PATHOLOGY  COUNTS:  YES and ACTION TAKEN: X-RAY(S) TAKEN no foreign body noted. There was not a lap count due to the STAT nature of the delivery  TOURNIQUET:  * No tourniquets in log *  DICTATION: .Note written in EPIC  PLAN OF CARE: Admit to inpatient   PATIENT DISPOSITION:  PACU - hemodynamically stable.   Delay start of Pharmacological VTE agent (>24hrs) due to surgical blood loss or risk of bleeding: yes  Complications: none immediate

## 2014-08-08 NOTE — Op Note (Signed)
08/08/2014  9:43 PM  PATIENT:  Victoria Mclaughlin  28 y.o. female  PRE-OPERATIVE DIAGNOSIS: Twin A vaginal delivery.  Twin B: Breech, failed complete breech extraction; Cord Prolapse  POST-OPERATIVE DIAGNOSIS:  Breech, Cord Prolapse  PROCEDURE:  Procedure(s): CESAREAN SECTION (N/A)  SURGEON:  Surgeon(s) and Role:    * Willodean Rosenthalarolyn Harraway-Smith, MD - Primary  ANESTHESIA:   general  EBL:  Total I/O In: 2100 [I.V.:2000; IV Piggyback:100] Out: 800 [Urine:100; Blood:700]  BLOOD ADMINISTERED:none  DRAINS: none   LOCAL MEDICATIONS USED:  MARCAINE     SPECIMEN:  Source of Specimen:  placenta  DISPOSITION OF SPECIMEN:  PATHOLOGY  COUNTS:  YES and ACTION TAKEN: X-RAY(S) TAKEN no foreign body noted. There was not a lap count due to the STAT nature of the delivery  TOURNIQUET:  * No tourniquets in log *  DICTATION: .Note written in EPIC  PLAN OF CARE: Admit to inpatient   PATIENT DISPOSITION:  PACU - hemodynamically stable.   Delay start of Pharmacological VTE agent (>24hrs) due to surgical blood loss or risk of bleeding: yes  Complications: none immediate  INDICATIONS: Victoria Mclaughlin is a 28 y.o. [id number] at 1935w2d who presented with c/o contractions in a twin gestation.  She was seen in MAU and noted to be 6cm.  Upon presentation to L&D she was noted to be fully dilated.  Twin A delivered vaginally and an attempt was made to deliver Twin B by breech extraction but, the fetus was not descending and the patient was not above to tolerate the procedure.  A cesarean section was deemed necessary secondary to the indications listed under preoperative diagnosis; please see preoperative note for further details.  The risks of cesarean section were discussed with the patient including but were not limited to: bleeding which may require transfusion or reoperation; infection which may require antibiotics; injury to bowel, bladder, ureters or other surrounding organs; injury to the fetus; need  for additional procedures including hysterectomy in the event of a life-threatening hemorrhage; placental abnormalities wth subsequent pregnancies, incisional problems, thromboembolic phenomenon and other postoperative/anesthesia complications.   The patient concurred with the proposed plan, giving informed written consent for the procedure.    FINDINGS:  Viable female infant in cephalic presentation.  Apgars 4 and 8.  Clear amniotic fluid.  Intact placenta, three vessel cord.  Normal uterus, fallopian tubes and ovaries bilaterally.  PROCEDURE IN DETAIL:  The patient received intravenous antibiotics after delivery of the fetus due to the STAT nature of the case.  She was placed in the dorsal supine position and general anesthesia was administered and was found to be adequate. She was then placed in a dorsal supine position with a leftward tilt, and prepped and draped in a sterile manner.  A foley catheter had been placed in her bladder and attached to constant gravity on L&D.  After an adequate timeout was performed, a Pfannenstiel skin incision was made with scalpel and carried through to the underlying layer of fascia. The fascia was incised in the midline, and this incision was extended bilaterally using the Mayo scissors.  Kocher clamps were applied to the superior aspect of the fascial incision and the underlying rectus muscles were dissected off bluntly. A similar process was carried out on the inferior aspect of the fascial incision. The rectus muscles were separated in the midline bluntly and the peritoneum was entered bluntly. Attention was turned to the lower uterine segment where a low transverse hysterotomy incision was made with a scalpel  and extended bilaterally bluntly.  The infant was successfully delivered, the cord was clamped and cut and the infant was handed over to awaiting neonatology team. Uterine massage was then administered, and the placenta delivered intact with a three-vessel cord.  The uterus was then cleared of clot and debris.  The hysterotomy was closed with 0 Vicryl in a running locked fashion, and an imbricating layer was also placed with the same suture. The uterus was returned to the pelvis. The pelvis was cleared of all clot and debris. Hemostasis was confirmed on all surfaces.  The peritoneum and the muscles were reapproximated using 0 Vicryl in 2 layers. The fascia was then closed using 0 Vicryl in a single suture.  The subcutaneous layer was irrigated and the skin was closed with a 4-0 Vicryl subcuticular stitch.  30 cc of 0.5% marcaine was injected into the incision and benzoin and steristrips were applied.  The patient tolerated the procedure well. The instrument and needle counts were correct x 2 but, because the laps were not counted prior to the case, an x-ray was obtained which showed no foreign bodies.  She was taken to the recovery room in stable condition.

## 2014-08-08 NOTE — Patient Instructions (Signed)
Labor Induction  Labor induction is when steps are taken to cause a pregnant woman to begin the labor process. Most women go into labor on their own between 37 weeks and 42 weeks of the pregnancy. When this does not happen or when there is a medical need, methods may be used to induce labor. Labor induction causes a pregnant woman's uterus to contract. It also causes the cervix to soften (ripen), open (dilate), and thin out (efface). Usually, labor is not induced before 39 weeks of the pregnancy unless there is a problem with the baby or mother.  Before inducing labor, your health care provider will consider a number of factors, including the following:  The medical condition of you and the baby.   How many weeks along you are.   The status of the baby's lung maturity.   The condition of the cervix.   The position of the baby.  WHAT ARE THE REASONS FOR LABOR INDUCTION? Labor may be induced for the following reasons:  The health of the baby or mother is at risk.   The pregnancy is overdue by 1 week or more.   The water breaks but labor does not start on its own.   The mother has a health condition or serious illness, such as high blood pressure, infection, placental abruption, or diabetes.  The amniotic fluid amounts are low around the baby.   The baby is distressed.  Convenience or wanting the baby to be born on a certain date is not a reason for inducing labor. WHAT METHODS ARE USED FOR LABOR INDUCTION? Several methods of labor induction may be used, such as:   Prostaglandin medicine. This medicine causes the cervix to dilate and ripen. The medicine will also start contractions. It can be taken by mouth or by inserting a suppository into the vagina.   Inserting a thin tube (catheter) with a balloon on the end into the vagina to dilate the cervix. Once inserted, the balloon is expanded with water, which causes the cervix to open.   Stripping the membranes. Your health  care provider separates amniotic sac tissue from the cervix, causing the cervix to be stretched and causing the release of a hormone called progesterone. This may cause the uterus to contract. It is often done during an office visit. You will be sent home to wait for the contractions to begin. You will then come in for an induction.   Breaking the water. Your health care provider makes a hole in the amniotic sac using a small instrument. Once the amniotic sac breaks, contractions should begin. This may still take hours to see an effect.   Medicine to trigger or strengthen contractions. This medicine is given through an IV access tube inserted into a vein in your arm.  All of the methods of induction, besides stripping the membranes, will be done in the hospital. Induction is done in the hospital so that you and the baby can be carefully monitored.  HOW LONG DOES IT TAKE FOR LABOR TO BE INDUCED? Some inductions can take up to 2-3 days. Depending on the cervix, it usually takes less time. It takes longer when you are induced early in the pregnancy or if this is your first pregnancy. If a mother is still pregnant and the induction has been going on for 2-3 days, either the mother will be sent home or a cesarean delivery will be needed. WHAT ARE THE RISKS ASSOCIATED WITH LABOR INDUCTION? Some of the risks of induction   include:   Changes in fetal heart rate, such as too high, too low, or erratic.   Fetal distress.   Chance of infection for the mother and baby.   Increased chance of having a cesarean delivery.   Breaking off (abruption) of the placenta from the uterus (rare).   Uterine rupture (very rare).  When induction is needed for medical reasons, the benefits of induction may outweigh the risks. WHAT ARE SOME REASONS FOR NOT INDUCING LABOR? Labor induction should not be done if:   It is shown that your baby does not tolerate labor.   You have had previous surgeries on your  uterus, such as a myomectomy or the removal of fibroids.   Your placenta lies very low in the uterus and blocks the opening of the cervix (placenta previa).   Your baby is not in a head-down position.   The umbilical cord drops down into the birth canal in front of the baby. This could cut off the baby's blood and oxygen supply.   You have had a previous cesarean delivery.   There are unusual circumstances, such as the baby being extremely premature.  Document Released: 12/16/2006 Document Revised: 03/29/2013 Document Reviewed: 02/23/2013 Exit[employer] Patient Information 2015 [employer]. This information is not intended to replace advice given to you by your health care provider. Make sure you discuss any questions you have with your health care provider.  

## 2014-08-08 NOTE — Progress Notes (Signed)
   Subjective:    Victoria Mclaughlin is a [id number] 3539w2d being seen today for her first obstetrical visit.  Her obstetrical history is significant for vaginal bleeding in 3rd trimester of this pregnancy/twin pregnancy.. Patient does not intend to breast feed. Pregnancy history fully reviewed.  Patient reports no complaints.  Filed Vitals:   08/08/14 0813  Temp: 97.7 F (36.5 C)  Weight: 213 lb 3.2 oz (96.707 kg)    HISTORY: OB History  Gravida Para Term Preterm AB SAB TAB Ectopic Multiple Living  6 2 2  0 3 0 3 0 0 2    # Outcome Date GA Lbr Len/2nd Weight Sex Delivery Anes PTL Lv  6 Current           5 Term 10/24/08 2728w0d  8 lb (3.629 kg) F Vag-Spont   Y     Complications: Postpartum hemorrhage  4 Term 07/31/07 7028w0d  8 lb (3.629 kg) M Vag-Spont   Y  3 TAB           2 TAB           1 TAB              Past Medical History  Diagnosis Date  . Infection     UTI  . Chlamydia    Past Surgical History  Procedure Laterality Date  . Induced abortion      x3   Family History  Problem Relation Age of Onset  . Stroke Maternal Aunt   . Hearing loss Neg Hx   . Diabetes Neg Hx   . Heart disease Neg Hx   . Asthma Brother      Exam    Uterus:   gravid  Pelvic Exam:    Perineum: No Hemorrhoids, Normal Perineum   Vulva: normal   Vagina:  normal mucosa       Cervix: no cervical motion tenderness   Adnexa: normal adnexa   Bony Pelvis: average  System: Breast:  Inspection negative   Skin: normal coloration and turgor, no rashes    Neurologic: oriented, normal mood   Extremities: normal strength, tone, and muscle mass   HEENT extra ocular movement intact   Mouth/Teeth mucous membranes moist, pharynx normal without lesions   Neck supple   Cardiovascular: regular rate and rhythm   Respiratory:  appears well, vitals normal, no respiratory distress, acyanotic, normal RR, ear and throat exam is normal, neck free of mass or lymphadenopathy, chest clear, no wheezing,  crepitations, rhonchi, normal symmetric air entry   Abdomen: soft, non-tender; bowel sounds normal; no masses,  no organomegaly   Urinary: urethral meatus normal      Assessment:    Pregnancy: [id number] Patient Active Problem List   Diagnosis Date Noted  . Third trimester bleeding 07/19/2014  . No prenatal care in current pregnancy in third trimester 07/19/2014  . Twin gestation in third trimester         Plan:     Initial labs drawn upon admission 12/10.  Awaiting response from MFM regarding labs needed for Lewis antibodies. Prenatal vitamins. Problem list reviewed and updated. Genetic Screening discussed too late  Ultrasound discussed; fetal survey:reviewed.  Growth ultrasound schedule  Follow up with IOL on Monday as directed by Dr. Penne LashLeggett. 50% of 30 min visit spent on counseling and coordination of care.     Bertram Denvereague Clark, Victoria Mclaughlin 08/08/2014

## 2014-08-08 NOTE — H&P (Signed)
  Subjective:  Victoria Mclaughlin is a 28 y.o. G6 17P2032 female with EDC of 08/27/2013 at 37 and 2/[redacted] weeks gestation by 34 week sono 07/19/2014 who is being admitted for labor management in a twin IUP.  She was scheduled for IOL 08/13/2014.  Her current obstetrical history is significant for bleeding in the third trimester and only 1 prenatal visit 12/30.2015.  Patient reports contractions and LOF.   Fetal Movement: normal. In the MAu she was 6cm.  By the time she arrived to L&D she was fully dilated.     Objective:   Vital signs in last 24 hours: Temp:  [97.4 F (36.3 C)-97.8 F (36.6 C)] 97.8 F (36.6 C) (12/30 2300) Pulse Rate:  [75-100] 75 (12/30 2315) Resp:  [15-30] 20 (12/30 2315) BP: (111-127)/(69-76) 111/74 mmHg (12/30 2315) SpO2:  [96 %-100 %] 99 % (12/30 2315) Weight:  [213 lb 3.2 oz (96.707 kg)] 213 lb 3.2 oz (96.707 kg) (12/30 0813) Pt in Mod distress.    Presentations: cephalic  Cervix:    Dilation: 10cm   Effacement: 100%   Station:  +1   Consistency: n/a   Position: middle   Prenatal Transfer Tool  Maternal Diabetes: No Genetic Screening: too late Maternal Ultrasounds/Referrals:too late Fetal Ultrasounds or other Referrals:  Other: twin IUP vertex/breech  Maternal Substance Abuse:  Yes:  Type: Marijuana Significant Maternal Medications:  None Significant Maternal Lab Results: None   Lab Review  B, Rh+, Rubella-immune, Hepatitis B surface antigen non-reactive, GBS negative  AFP:too late  One hour GTT: too late   Assessment/Plan:  37 and 2/[redacted] weeks gestation. Active phase labor. Obstetrical history significant for late prenatal care and third trimester bleeding.     Risks, benefits, alternatives and possible complications have been discussed in detail with the patient.  Pre-admission, admission, and post admission procedures and expectations were discussed in detail.  All questions answered, all appropriate consents will be signed at the Hospital.  Anticipate  vaginal delivery. d/w pt vertex/breech situation of twins.  She is aware that if Baby B is unable to be delivered by breech extraction that she will need an epidural.  She is also aware that she will not be able to get an epidural due to the her advanced labor in a multiparous state.

## 2014-08-08 NOTE — Transfer of Care (Signed)
Immediate Anesthesia Transfer of Care Note  Patient: Victoria Mclaughlin  Procedure(s) Performed: Procedure(s): CESAREAN SECTION (N/A)  Patient Location: PACU  Anesthesia Type:General  Level of Consciousness: sedated  Airway & Oxygen Therapy: Patient Spontanous Breathing and Patient connected to nasal cannula oxygen  Post-op Assessment: Report given to PACU RN and Post -op Vital signs reviewed and stable  Post vital signs: stable  Complications: No apparent anesthesia complications

## 2014-08-08 NOTE — MAU Note (Signed)
Pt in birthing suites room ambulating to BR.

## 2014-08-09 ENCOUNTER — Encounter (HOSPITAL_COMMUNITY): Payer: Self-pay | Admitting: Obstetrics & Gynecology

## 2014-08-09 LAB — CBC
HCT: 24.4 % — ABNORMAL LOW (ref 36.0–46.0)
Hemoglobin: 8.2 g/dL — ABNORMAL LOW (ref 12.0–15.0)
MCH: 24.6 pg — ABNORMAL LOW (ref 26.0–34.0)
MCHC: 33.6 g/dL (ref 30.0–36.0)
MCV: 73.3 fL — ABNORMAL LOW (ref 78.0–100.0)
Platelets: 261 10*3/uL (ref 150–400)
RBC: 3.33 MIL/uL — ABNORMAL LOW (ref 3.87–5.11)
RDW: 17.3 % — ABNORMAL HIGH (ref 11.5–15.5)
WBC: 18.3 10*3/uL — AB (ref 4.0–10.5)

## 2014-08-09 MED ORDER — MORPHINE SULFATE 4 MG/ML IJ SOLN: 2.0000 mg | INTRAMUSCULAR | Status: DC | PRN

## 2014-08-09 MED ORDER — KETOROLAC TROMETHAMINE 30 MG/ML IJ SOLN
30.0000 mg | Freq: Four times a day (QID) | INTRAMUSCULAR | Status: DC
  Administered 2014-08-09: 30 mg via INTRAVENOUS
  Filled 2014-08-09 (×2): qty 1

## 2014-08-09 MED ORDER — MORPHINE SULFATE 4 MG/ML IJ SOLN
4.0000 mg | Freq: Once | INTRAMUSCULAR | Status: AC
  Administered 2014-08-09: 4 mg via INTRAVENOUS
  Filled 2014-08-09: qty 1

## 2014-08-09 MED ORDER — IBUPROFEN 600 MG PO TABS
600.0000 mg | ORAL_TABLET | Freq: Four times a day (QID) | ORAL | Status: DC
  Administered 2014-08-09 – 2014-08-11 (×8): 600 mg via ORAL
  Filled 2014-08-09 (×8): qty 1

## 2014-08-09 NOTE — Anesthesia Postprocedure Evaluation (Signed)
  Anesthesia Post-op Note  Patient: Victoria Mclaughlin  Procedure(s) Performed: Procedure(s): CESAREAN SECTION (N/A)  Patient Location: PACU and Mother/Baby  Anesthesia Type:General and Epidural  Level of Consciousness: awake, alert , oriented and patient cooperative  Airway and Oxygen Therapy: Patient Spontanous Breathing  Post-op Pain: none  Post-op Assessment: Post-op Vital signs reviewed, Patient's Cardiovascular Status Stable, Respiratory Function Stable, Patent Airway, No signs of Nausea or vomiting, Adequate PO intake, Pain level controlled, No headache, No backache, No residual numbness and No residual motor weakness  Post-op Vital Signs: Reviewed and stable  Last Vitals:  Filed Vitals:   08/09/14 0653  BP: 116/66  Pulse: 77  Temp: 36.7 C  Resp:     Complications: No apparent anesthesia complications

## 2014-08-09 NOTE — Addendum Note (Signed)
Addendum  created 08/09/14 0836 by Orlie Pollenebra R Purity Irmen, CRNA   Modules edited: Notes Section   Notes Section:  File: [id number]

## 2014-08-09 NOTE — Progress Notes (Signed)
Subjective: Postpartum Day 1: SVD of twin A/Cesarean Delivery of twin B Patient reports incisional pain controlled with pain medication, she ambulated a little. She denies CP, SOB, lightheadedness/dizziness    Objective: Vital signs in last 24 hours: Temp:  [97.2 F (36.2 C)-98 F (36.7 C)] 98 F (36.7 C) (12/31 0653) Pulse Rate:  [71-100] 77 (12/31 0653) Resp:  [15-30] 22 (12/30 2331) BP: (108-127)/(66-76) 116/66 mmHg (12/31 0653) SpO2:  [96 %-100 %] 100 % (12/31 [id number])  Physical Exam:  General: alert, cooperative and no distress Lochia: appropriate Uterine Fundus: firm Incision: honeycomb dressing- clean, dry and intact DVT Evaluation: No evidence of DVT seen on physical exam. No cords or calf tenderness.   Recent Labs  08/09/14 0530  HGB 8.2*  HCT 24.4*    Assessment/Plan: Status post Cesarean section. Doing well postoperatively.  Continue current care. Encourage ambulation  Victoria Mclaughlin 08/09/2014, 10:38 AM

## 2014-08-10 LAB — CULTURE, OB URINE: Colony Count: >=100000

## 2014-08-10 NOTE — Progress Notes (Signed)
CSW attempted to meet with MOB to complete assessment for LPNC and hx of THC use, but she had visitors at this time.  CSW familiar with this patient from a stay on Antenatal a few weeks ago.  CSW congratulated MOB on the birth of her twins and states CSW will come back at a later time to talk with her.  She agreed. 

## 2014-08-10 NOTE — Progress Notes (Signed)
Subjective: Postpartum Day 2: Cesarean Delivery for baby B.  Patient reports incisional pain, tolerating PO, + flatus and no problems voiding.   FOB is not able to complete birth certificate til he gets his ID  Updated. Objective: Vital signs in last 24 hours: Temp:  [97.7 F (36.5 C)-98.6 F (37 C)] 98.6 F (37 C) (01/01 0615) Pulse Rate:  [73-108] 89 (01/01 0615) Resp:  [18] 18 (01/01 0615) BP: (93-114)/(61-74) 107/70 mmHg (01/01 0615) SpO2:  [100 %] 100 % (12/31 1639)  Physical Exam:  General: alert, cooperative and no distress Lochia: appropriate Uterine Fundus: firm Incision: healing well, no significant drainage, no significant erythema DVT Evaluation: No evidence of DVT seen on physical exam.   Recent Labs  08/09/14 0530  HGB 8.2*  HCT 24.4*    Assessment/Plan:  POD 2.Status post Cesarean section baby B, failed Breec h extroa'. Doing well postoperatively.  Continue current care, probable discharge Saturday.  Victoria Mclaughlin V 08/10/2014, 11:13 AM

## 2014-08-10 NOTE — Progress Notes (Signed)
Clinical Social Work Department BRIEF PSYCHOSOCIAL ASSESSMENT 08/10/2014  Patient:  Victoria Mclaughlin     Account Number:  [id number]     Admit date:  12-11-2013  Clinical Social Worker:  Barbarann Ehlers  Date/Time:  08/10/2014 01:00 PM  Referred by:  RN  Date Referred:  10-07-2013 Referred for  Substance Abuse  Other - See comment   Other Referral:   [facility]   Interview type:  Family Other interview type:    PSYCHOSOCIAL DATA Living Status:  FAMILY Admitted from facility:   Level of care:   Primary support name:  Anselmo Pickler Primary support relationship to patient:  PARENT Degree of support available:   Good supports-MOB lives with FOB who is involved and supportive.  MOB has two children, ages 26 and 40.  MGF lives in the home with them and is supportive.    CURRENT CONCERNS Current Concerns  None Noted   Other Concerns:   hx of marijuana use.    SOCIAL WORK ASSESSMENT / PLAN & PATIENT'S/FAMILY'S RESPONSE TO PLAN OF CARE:  CSW met with MOB in her first floor room to complete assessment for [facility] and hx of marijuana use.  FOB was asleep, but MOB woke him up when CSW arrived and gave permission to talk about anything with him present.  CSW is familiar with MOB from an admission 2 weeks ago in the Antenatal Unit.  MOB seemed happy to see CSW and thanked CSW again for the support and assistance provided 2 weeks ago.  MOB introduced CSW to FOB.  MOB states she and babies are doing well and that she is very excited about having twins.  She states she has not gotten much sleep, but does not expect to.  She reports having a good support system and is still working on getting every thing she needs for babies at home.  She states her family and friends are helping.  CSW informed her on how to make a safe sleep environment for babies if they do not yet have bassinets. She was appreciative.  She agrees not to sleep in bed with babies.  CSW informed MOB and FOB of hospital drug screen policy,  to which they were understanding.  MOB admits smoking marijuana once to help her sleep.  They are understanding of the possibility that babies' MDS results may be positive, since this use was recent, and the possibility that CPS will be involved if the screens are positive.  MOB denies any hx with CPS.  They report no questions about this process.  MOB states she did not know she was pregnant until far along into the pregnancy and then admits that she was in denial.  She reports that she is trying to get her car fixed so that she has her own transportation again, but will utilize Medicaid Transportation for pediatric appointments if necessary, as transportation has been a barrier for her recently.  CSW discussed signs and symptoms of PPD and MOB commits to talking with her doctor if she has concerns at any time. She states appreciation for CSW's visit and concern for her wellbeing and states no concerns or needs for CSW at this time.  CSW identifies no barriers to discharge.   Assessment/plan status:  No Further Intervention Required Other assessment/ plan:   Information/referral to community resources:   No referrral needs noted at this time.

## 2014-08-11 MED ORDER — OXYCODONE-ACETAMINOPHEN 5-325 MG PO TABS: 1.0000 | ORAL_TABLET | ORAL | Status: DC | PRN

## 2014-08-11 MED ORDER — IBUPROFEN 600 MG PO TABS: 600.0000 mg | ORAL_TABLET | Freq: Four times a day (QID) | ORAL | Status: DC | PRN

## 2014-08-11 NOTE — Discharge Instructions (Signed)
NO SEX UNTIL AFTER YOU GET YOUR BIRTH CONTROL  Cesarean Delivery, Care After Refer to this sheet in the next few weeks. These instructions provide you with information on caring for yourself after your procedure. Your health care provider may also give you specific instructions. Your treatment has been planned according to current medical practices, but problems sometimes occur. Call your health care provider if you have any problems or questions after you go home. HOME CARE INSTRUCTIONS  Only take over-the-counter or prescription medications as directed by your health care provider.  Do not drink alcohol, especially if you are breastfeeding or taking medication to relieve pain.  Do not chew or smoke tobacco.  Continue to use good perineal care. Good perineal care includes:  Wiping your perineum from front to back.  Keeping your perineum clean.  Check your surgical cut (incision) daily for increased redness, drainage, swelling, or separation of skin.  Clean your incision gently with soap and water every day, and then pat it dry. If your health care provider says it is okay, leave the incision uncovered. Use a bandage (dressing) if the incision is draining fluid or appears irritated. If the adhesive strips across the incision do not fall off within 7 days, carefully peel them off.  Hug a pillow when coughing or sneezing until your incision is healed. This helps to relieve pain.  Do not use tampons or douche until your health care provider says it is okay.  Shower, wash your hair, and take tub baths as directed by your health care provider.  Wear a well-fitting bra that provides breast support.  Limit wearing support panties or control-top hose.  Drink enough fluids to keep your urine clear or pale yellow.  Eat high-fiber foods such as whole grain cereals and breads, brown rice, beans, and fresh fruits and vegetables every day. These foods may help prevent or relieve  constipation.  Resume activities such as climbing stairs, driving, lifting, exercising, or traveling as directed by your health care provider.  Talk to your health care provider about resuming sexual activities. This is dependent upon your risk of infection, your rate of healing, and your comfort and desire to resume sexual activity.  Try to have someone help you with your household activities and your newborn for at least a few days after you leave the hospital.  Rest as much as possible. Try to rest or take a nap when your newborn is sleeping.  Increase your activities gradually.  Keep all of your scheduled postpartum appointments. It is very important to keep your scheduled follow-up appointments. At these appointments, your health care provider will be checking to make sure that you are healing physically and emotionally. SEEK MEDICAL CARE IF:   You are passing large clots from your vagina. Save any clots to show your health care provider.  You have a foul smelling discharge from your vagina.  You have trouble urinating.  You are urinating frequently.  You have pain when you urinate.  You have a change in your bowel movements.  You have increasing redness, pain, or swelling near your incision.  You have pus draining from your incision.  Your incision is separating.  You have painful, hard, or reddened breasts.  You have a severe headache.  You have blurred vision or see spots.  You feel sad or depressed.  You have thoughts of hurting yourself or your newborn.  You have questions about your care, the care of your newborn, or medications.  You are  dizzy or light-headed.  You have a rash.  You have pain, redness, or swelling at the site of the removed intravenous access (IV) tube.  You have nausea or vomiting.  You stopped breastfeeding and have not had a menstrual period within 12 weeks of stopping.  You are not breastfeeding and have not had a menstrual  period within 12 weeks of delivery.  You have a fever. SEEK IMMEDIATE MEDICAL CARE IF:  You have persistent pain.  You have chest pain.  You have shortness of breath.  You faint.  You have leg pain.  You have stomach pain.  Your vaginal bleeding saturates 2 or more sanitary pads in 1 hour. MAKE SURE YOU:   Understand these instructions.  Will watch your condition.  Will get help right away if you are not doing well or get worse. Document Released: 04/18/2002 Document Revised: 12/11/2013 Document Reviewed: 03/23/2012 [facility] Patient Information 2015 [place], [place]. This information is not intended to replace advice given to you by your health care provider. Make sure you discuss any questions you have with your health care provider.  Postpartum Care After Vaginal Delivery After you deliver your newborn (postpartum period), the usual stay in the hospital is 24-72 hours. If there were problems with your labor or delivery, or if you have other medical problems, you might be in the hospital longer.  While you are in the hospital, you will receive help and instructions on how to care for yourself and your newborn during the postpartum period.  While you are in the hospital:  Be sure to tell your nurses if you have pain or discomfort, as well as where you feel the pain and what makes the pain worse.  If you had an incision made near your vagina (episiotomy) or if you had some tearing during delivery, the nurses may put ice packs on your episiotomy or tear. The ice packs may help to reduce the pain and swelling.  If you are breastfeeding, you may feel uncomfortable contractions of your uterus for a couple of weeks. This is normal. The contractions help your uterus get back to normal size.  It is normal to have some bleeding after delivery.  For the first 1-3 days after delivery, the flow is red and the amount may be similar to a period.  It is common for the flow to start and  stop.  In the first few days, you may pass some small clots. Let your nurses know if you begin to pass large clots or your flow increases.  Do not  flush blood clots down the toilet before having the nurse look at them.  During the next 3-10 days after delivery, your flow should become more watery and pink or brown-tinged in color.  Ten to fourteen days after delivery, your flow should be a small amount of yellowish-white discharge.  The amount of your flow will decrease over the first few weeks after delivery. Your flow may stop in 6-8 weeks. Most women have had their flow stop by 12 weeks after delivery.  You should change your sanitary pads frequently.  Wash your hands thoroughly with soap and water for at least 20 seconds after changing pads, using the toilet, or before holding or feeding your newborn.  You should feel like you need to empty your bladder within the first 6-8 hours after delivery.  In case you become weak, lightheaded, or faint, call your nurse before you get out of bed for the first time and  before you take a shower for the first time.  Within the first few days after delivery, your breasts may begin to feel tender and full. This is called engorgement. Breast tenderness usually goes away within 48-72 hours after engorgement occurs. You may also notice milk leaking from your breasts. If you are not breastfeeding, do not stimulate your breasts. Breast stimulation can make your breasts produce more milk.  Spending as much time as possible with your newborn is very important. During this time, you and your newborn can feel close and get to know each other. Having your newborn stay in your room (rooming in) will help to strengthen the bond with your newborn. It will give you time to get to know your newborn and become comfortable caring for your newborn.  Your hormones change after delivery. Sometimes the hormone changes can temporarily cause you to feel sad or tearful. These  feelings should not last more than a few days. If these feelings last longer than that, you should talk to your caregiver.  If desired, talk to your caregiver about methods of family planning or contraception.  Talk to your caregiver about immunizations. Your caregiver may want you to have the following immunizations before leaving the hospital:  Tetanus, diphtheria, and pertussis (Tdap) or tetanus and diphtheria (Td) immunization. It is very important that you and your family (including grandparents) or others caring for your newborn are up-to-date with the Tdap or Td immunizations. The Tdap or Td immunization can help protect your newborn from getting ill.  Rubella immunization.  Varicella (chickenpox) immunization.  Influenza immunization. You should receive this annual immunization if you did not receive the immunization during your pregnancy. Document Released: 05/24/2007 Document Revised: 04/20/2012 Document Reviewed: 03/23/2012 [facility] Patient Information 2015 [place], [place]. This information is not intended to replace advice given to you by your health care provider. Make sure you discuss any questions you have with your health care provider.

## 2014-08-11 NOTE — Discharge Summary (Signed)
Obstetric Discharge Summary Reason for Admission: onset of labor Prenatal Procedures: ultrasound Intrapartum Procedures: SVB of vtx twin A, PLTCS for transverse twin B Postpartum Procedures: none Complications-Operative and Postpartum: none Eating, drinking, voiding, ambulating well.  +flatus.  Lochia and pain wnl.  Denies dizziness, lightheadedness, or sob. No complaints.   Hospital Course: Victoria Mclaughlin is a 29 y.o. [phone] female admited at 63.2wks in active labor w/ twins. Only 1 pnv.  She progressed normally to deliver vtx twin A vaginally, twin B was transverse and pt did not have epidural and could not tolerate attempted breech extraction, so had PLTCS for twin B. Her pp course has been uncomplicated.  By PPD#3 she is doing well and is deemed to have received the full benefit of her hospital stay. SW has seen pt d/t limited pnc, and no barriers to d/c.   Filed Vitals:   08/11/14 0613  BP: 119/69  Pulse: 73  Temp: 98 F (36.7 C)  Resp: 18   H/H: Lab Results  Component Value Date/Time   HGB 8.2* 08/09/2014 05:30 AM   HCT 24.4* 08/09/2014 05:30 AM    Physical Exam: General: alert, cooperative and no distress Abdomen/Uterine Fundus: Appropriately tender, non-distended, FF @ U Incision: healing well, honeycomb dressing intact Lochia: appropriate Extremities: No evidence of DVT seen on physical exam. Negative Homan's sign, no cords, calf tenderness, or significant calf/ankle edema   Discharge Diagnoses: Term Pregnancy-delivered and SVB of Twin A, PLTCS for Twin B  Discharge Information: Date: 02/19/2011 Activity: pelvic rest Diet: routine  Medications: PNV, Ibuprofen and Percocet Breast feeding: No: bottle Contraception: Mirena Circumcision: n/a Condition: stable Instructions: refer to handout Discharge to: home  Infant: Home with mother  Follow-up Information    Follow up with [facility].   Specialty:  Obstetrics and Gynecology   Why:  4-6 weeks  for your postpartum visit   Contact information:   [address] [place] [address] [phone]      Marge Duncans, CNM, [facility] 08/11/2014,11:05 AM

## 2014-08-13 ENCOUNTER — Ambulatory Visit (HOSPITAL_COMMUNITY): Payer: Self-pay

## 2014-08-14 ENCOUNTER — Inpatient Hospital Stay (HOSPITAL_COMMUNITY): Payer: MEDICAID

## 2014-08-16 LAB — TYPE AND SCREEN
ABO/RH(D): B POS
ANTIBODY SCREEN: POSITIVE
DAT, IgG: NEGATIVE
DONOR AG TYPE: NEGATIVE
Donor AG Type: NEGATIVE
UNIT DIVISION: 0
UNIT DIVISION: 0

## 2014-08-21 ENCOUNTER — Encounter: Payer: Self-pay | Admitting: General Practice

## 2014-09-19 ENCOUNTER — Ambulatory Visit: Payer: Self-pay | Admitting: Obstetrics and Gynecology

## 2014-09-19 ENCOUNTER — Encounter: Payer: Self-pay | Admitting: *Deleted

## 2014-09-19 ENCOUNTER — Telehealth: Payer: Self-pay | Admitting: *Deleted

## 2014-09-19 NOTE — Telephone Encounter (Signed)
Called [facility], spoke with a female who said she is at work. Asked him to give her a message that she missed an appointment- please call us back to reschedule.  Will send letter.

## 2014-12-14 ENCOUNTER — Emergency Department (HOSPITAL_COMMUNITY)
Admission: EM | Admit: 2014-12-14 | Discharge: 2014-12-14 | Disposition: A | Discharge status: Home / Self Care | Payer: Medicaid Other | Attending: Emergency Medicine | Admitting: Emergency Medicine

## 2014-12-14 ENCOUNTER — Encounter (HOSPITAL_COMMUNITY): Payer: Self-pay | Admitting: Emergency Medicine

## 2014-12-14 DIAGNOSIS — Z87440 Personal history of urinary (tract) infections: Secondary | ICD-10-CM | POA: Insufficient documentation

## 2014-12-14 DIAGNOSIS — Z79899 Other long term (current) drug therapy: Secondary | ICD-10-CM | POA: Diagnosis not present

## 2014-12-14 DIAGNOSIS — Z880 Allergy status to penicillin: Secondary | ICD-10-CM | POA: Diagnosis not present

## 2014-12-14 DIAGNOSIS — K029 Dental caries, unspecified: Secondary | ICD-10-CM | POA: Insufficient documentation

## 2014-12-14 DIAGNOSIS — J45909 Unspecified asthma, uncomplicated: Secondary | ICD-10-CM | POA: Insufficient documentation

## 2014-12-14 DIAGNOSIS — K088 Other specified disorders of teeth and supporting structures: Secondary | ICD-10-CM | POA: Diagnosis present

## 2014-12-14 DIAGNOSIS — K047 Periapical abscess without sinus: Secondary | ICD-10-CM | POA: Diagnosis not present

## 2014-12-14 DIAGNOSIS — Z8619 Personal history of other infectious and parasitic diseases: Secondary | ICD-10-CM | POA: Diagnosis not present

## 2014-12-14 MED ORDER — CLINDAMYCIN HCL 150 MG PO CAPS: 450.0000 mg | ORAL_CAPSULE | Freq: Three times a day (TID) | ORAL | Status: DC

## 2014-12-14 MED ORDER — IBUPROFEN 800 MG PO TABS: 800.0000 mg | ORAL_TABLET | Freq: Three times a day (TID) | ORAL | Status: DC

## 2014-12-14 NOTE — Discharge Instructions (Signed)

## 2014-12-14 NOTE — ED Provider Notes (Signed)
CSN: [id number]     Arrival date & time 12/14/14  1429 History  This chart was scribed for non-physician practitioner Jinny SandersJoseph Dejan Angert, PA-C working with No att. providers found by Conchita ParisNadim Abuhashem, ED Scribe. This patient was seen in WTR8/WTR8 and the patient's care was started at 4:21 PM.   No chief complaint on file.  The history is provided by the patient. No language interpreter was used.    HPI Comments: Victoria Mclaughlin is a 29 y.o. female who presents to the Emergency Department complaining of a new dental pain with associated right sided facial swelling onset 10 days ago. Pt is unable to open her jaw and eat due to the pain. Pt states the swelling has mildly improved over the past several days. She has an appointment scheduled with a dentist next Wednesday. Pt denies fever, nausea, vomiting, dysphagia, shortness of breath.. Pt states there is no chance she is pregnant.  Past Medical History  Diagnosis Date  . Infection     UTI  . Chlamydia   . Asthma     childhood   Past Surgical History  Procedure Laterality Date  . Induced abortion      x3  . Cesarean section N/A 08/08/2014    Procedure: CESAREAN SECTION;  Surgeon: Willodean Rosenthalarolyn Harraway-Smith, MD;  Location: [facility];  Service: Obstetrics;  Laterality: N/A;   Family History  Problem Relation Age of Onset  . Stroke Maternal Aunt   . Hearing loss Neg Hx   . Diabetes Neg Hx   . Heart disease Neg Hx   . Asthma Brother    History  Substance Use Topics  . Smoking status: Never Smoker   . Smokeless tobacco: Never Used  . Alcohol Use: No   OB History    Gravida Para Term Preterm AB TAB SAB Ectopic Multiple Living   6 3 3  0 3 3 0 0 1 4     Review of Systems  Constitutional: Negative for fever.  HENT: Positive for dental problem and facial swelling.    Allergies  Penicillins and Shellfish allergy  Home Medications   Prior to Admission medications   Medication Sig Start Date End Date Taking? Authorizing Provider  clindamycin  (CLEOCIN) 150 MG capsule Take 3 capsules (450 mg total) by mouth 3 (three) times daily. 12/14/14   Ladona MowJoe Adrean Heitz, PA-C  ibuprofen (ADVIL,MOTRIN) 800 MG tablet Take 1 tablet (800 mg total) by mouth 3 (three) times daily. 12/14/14   Ladona MowJoe Smith Mcnicholas, PA-C  oxyCODONE-acetaminophen (PERCOCET/ROXICET) 5-325 MG per tablet Take 1 tablet by mouth every 4 (four) hours as needed for moderate pain or severe pain. 08/11/14   Cheral MarkerKimberly R Booker, CNM  Prenatal Vit-Fe Fumarate-FA (PRENATAL MULTIVITAMIN) TABS tablet Take 1 tablet by mouth daily at 12 noon.    Historical Provider, MD   BP 119/73 mmHg  Pulse 85  Temp(Src) 98.4 F (36.9 C) (Oral)  Resp 16  SpO2 100%  LMP 12/05/2014  Breastfeeding? No Physical Exam  Constitutional: She is oriented to person, place, and time. She appears well-developed and well-nourished. No distress.  HENT:  Head: Normocephalic and atraumatic.  Mouth/Throat: Uvula is midline, oropharynx is clear and moist and mucous membranes are normal. No trismus in the jaw. Dental abscesses and dental caries present. No uvula swelling. No oropharyngeal exudate, posterior oropharyngeal edema, posterior oropharyngeal erythema or tonsillar abscesses.  Mild right-sided facial swelling. obvious dental caries to the lower right rear molar with an abscess which drained on palpation. Floor of mouth is soft.  No induration or signs of cellulitis. No trismus. No tonsillar swelling.  Eyes: Pupils are equal, round, and reactive to light.  Neck: Normal range of motion and full passive range of motion without pain. Neck supple. No spinous process tenderness and no muscular tenderness present. No rigidity. No edema, no erythema and normal range of motion present. No Brudzinski's sign and no Kernig's sign noted.  Cardiovascular: Normal rate and regular rhythm.   Pulmonary/Chest: Effort normal. No respiratory distress.  Musculoskeletal: Normal range of motion.  Neurological: She is alert and oriented to person, place, and time.   Skin: Skin is warm and dry.  Psychiatric: She has a normal mood and affect. Her behavior is normal.  Nursing note and vitals reviewed.  ED Course  INCISION AND DRAINAGE Date/Time: 12/14/2014 8:45 PM Performed by: Ladona MowMINTZ, Shantana Christon Authorized by: Ladona MowMINTZ, Strother Everitt Consent given by: patient Patient identity confirmed: verbally with patient Type: abscess Body area: mouth Location details: alveolar process Patient sedated: no Drainage: purulent Drainage amount: moderate Wound treatment: wound left open Patient tolerance: Patient tolerated the procedure well with no immediate complications Comments: Dental abscess drained with palpation and manipulation.    DIAGNOSTIC STUDIES: Oxygen Saturation is 100% on room air, normal by my interpretation.    COORDINATION OF CARE: 4:28 PM Discussed treatment plan with pt at bedside and pt agreed to plan.  Labs Review Labs Reviewed - No data to display  Imaging Review No results found.   EKG Interpretation None      MDM   Final diagnoses:  Dental abscess   Patient with toothache with associated facial swelling.  Patient afebrile, hemodynamically stable, well-appearing and in no acute distress. Abscess noted on exam which was drained with palpation of it, this provided some mild relief of patient's symptoms.  Exam unconcerning for Ludwig's angina or spread of infection.  Will treat with clindamycin and pain medicine.  Urged patient to follow-up with dentist.  Patient states she has dentist, plans and following up next week. Discussed return precautions with patient, patient verbalizes understanding and agreement of this plan.   I personally performed the services described in this documentation, which was scribed in my presence. The recorded information has been reviewed and is accurate.  BP 119/73 mmHg  Pulse 85  Temp(Src) 98.4 F (36.9 C) (Oral)  Resp 16  SpO2 100%  LMP 12/05/2014  Breastfeeding? No  Signed,  Ladona MowJoe Aluel Schwarz, PA-C 8:45  PM      Ladona MowJoe Tynetta Bachmann, PA-C 12/14/14 2044  Ladona MowJoe Meyer Arora, PA-C 12/14/14 [id number]  Bethann BerkshireJoseph Zammit, MD 12/14/14 [phone]

## 2014-12-14 NOTE — ED Notes (Signed)
Pt c/o right lower molar pain with moderate facial edema present, onset last Wednesday.

## 2019-04-29 ENCOUNTER — Inpatient Hospital Stay (HOSPITAL_COMMUNITY)
Admission: EM | Admit: 2019-04-29 | Discharge: 2019-05-08 | DRG: 964 | Disposition: A | Discharge status: Home / Self Care | Payer: Medicaid Other | Attending: Interventional Radiology | Admitting: Interventional Radiology

## 2019-04-29 ENCOUNTER — Encounter (HOSPITAL_COMMUNITY): Payer: Self-pay | Admitting: Emergency Medicine

## 2019-04-29 ENCOUNTER — Emergency Department (HOSPITAL_COMMUNITY): Payer: Medicaid Other

## 2019-04-29 ENCOUNTER — Other Ambulatory Visit: Payer: Self-pay

## 2019-04-29 ENCOUNTER — Inpatient Hospital Stay (HOSPITAL_COMMUNITY): Payer: Medicaid Other

## 2019-04-29 DIAGNOSIS — R310 Gross hematuria: Secondary | ICD-10-CM | POA: Diagnosis present

## 2019-04-29 DIAGNOSIS — S37031A Laceration of right kidney, unspecified degree, initial encounter: Secondary | ICD-10-CM

## 2019-04-29 DIAGNOSIS — R0602 Shortness of breath: Secondary | ICD-10-CM | POA: Diagnosis not present

## 2019-04-29 DIAGNOSIS — S3722XA Contusion of bladder, initial encounter: Secondary | ICD-10-CM | POA: Diagnosis present

## 2019-04-29 DIAGNOSIS — S0990XA Unspecified injury of head, initial encounter: Secondary | ICD-10-CM | POA: Diagnosis not present

## 2019-04-29 DIAGNOSIS — Z91013 Allergy to seafood: Secondary | ICD-10-CM | POA: Diagnosis not present

## 2019-04-29 DIAGNOSIS — Z880 Allergy status to penicillin: Secondary | ICD-10-CM | POA: Diagnosis not present

## 2019-04-29 DIAGNOSIS — J90 Pleural effusion, not elsewhere classified: Secondary | ICD-10-CM | POA: Diagnosis present

## 2019-04-29 DIAGNOSIS — Z79899 Other long term (current) drug therapy: Secondary | ICD-10-CM | POA: Diagnosis not present

## 2019-04-29 DIAGNOSIS — Z531 Procedure and treatment not carried out because of patient's decision for reasons of belief and group pressure: Secondary | ICD-10-CM | POA: Diagnosis present

## 2019-04-29 DIAGNOSIS — R188 Other ascites: Secondary | ICD-10-CM | POA: Diagnosis not present

## 2019-04-29 DIAGNOSIS — S199XXA Unspecified injury of neck, initial encounter: Secondary | ICD-10-CM | POA: Diagnosis not present

## 2019-04-29 DIAGNOSIS — Z20828 Contact with and (suspected) exposure to other viral communicable diseases: Secondary | ICD-10-CM | POA: Diagnosis present

## 2019-04-29 DIAGNOSIS — S37039A Laceration of unspecified kidney, unspecified degree, initial encounter: Secondary | ICD-10-CM

## 2019-04-29 DIAGNOSIS — S37051A Moderate laceration of right kidney, initial encounter: Principal | ICD-10-CM | POA: Diagnosis present

## 2019-04-29 DIAGNOSIS — J9811 Atelectasis: Secondary | ICD-10-CM | POA: Diagnosis present

## 2019-04-29 DIAGNOSIS — S36116A Major laceration of liver, initial encounter: Secondary | ICD-10-CM | POA: Diagnosis present

## 2019-04-29 DIAGNOSIS — D62 Acute posthemorrhagic anemia: Secondary | ICD-10-CM | POA: Diagnosis present

## 2019-04-29 DIAGNOSIS — J939 Pneumothorax, unspecified: Secondary | ICD-10-CM | POA: Diagnosis not present

## 2019-04-29 DIAGNOSIS — R509 Fever, unspecified: Secondary | ICD-10-CM | POA: Diagnosis not present

## 2019-04-29 DIAGNOSIS — S36113A Laceration of liver, unspecified degree, initial encounter: Secondary | ICD-10-CM | POA: Diagnosis not present

## 2019-04-29 DIAGNOSIS — R52 Pain, unspecified: Secondary | ICD-10-CM | POA: Diagnosis not present

## 2019-04-29 DIAGNOSIS — R079 Chest pain, unspecified: Secondary | ICD-10-CM | POA: Diagnosis not present

## 2019-04-29 DIAGNOSIS — S37001A Unspecified injury of right kidney, initial encounter: Secondary | ICD-10-CM | POA: Diagnosis not present

## 2019-04-29 DIAGNOSIS — S299XXA Unspecified injury of thorax, initial encounter: Secondary | ICD-10-CM | POA: Diagnosis not present

## 2019-04-29 DIAGNOSIS — I959 Hypotension, unspecified: Secondary | ICD-10-CM | POA: Diagnosis not present

## 2019-04-29 DIAGNOSIS — S36115A Moderate laceration of liver, initial encounter: Secondary | ICD-10-CM | POA: Diagnosis not present

## 2019-04-29 DIAGNOSIS — S270XXA Traumatic pneumothorax, initial encounter: Secondary | ICD-10-CM | POA: Diagnosis not present

## 2019-04-29 LAB — COMPREHENSIVE METABOLIC PANEL WITH GFR
ALT: 77 U/L — ABNORMAL HIGH (ref 0–44)
AST: 105 U/L — ABNORMAL HIGH (ref 15–41)
Albumin: 3.7 g/dL (ref 3.5–5.0)
Alkaline Phosphatase: 59 U/L (ref 38–126)
Anion gap: 11 (ref 5–15)
BUN: 10 mg/dL (ref 6–20)
CO2: 23 mmol/L (ref 22–32)
Calcium: 9.2 mg/dL (ref 8.9–10.3)
Chloride: 104 mmol/L (ref 98–111)
Creatinine, Ser: 1.03 mg/dL — ABNORMAL HIGH (ref 0.44–1.00)
GFR calc Af Amer: >60 mL/min
GFR calc non Af Amer: >60 mL/min
Glucose, Bld: 128 mg/dL — ABNORMAL HIGH (ref 70–99)
Potassium: 3.9 mmol/L (ref 3.5–5.1)
Sodium: 138 mmol/L (ref 135–145)
Total Bilirubin: 0.4 mg/dL (ref 0.3–1.2)
Total Protein: 7.1 g/dL (ref 6.5–8.1)

## 2019-04-29 LAB — CBC
HCT: 34.2 % — ABNORMAL LOW (ref 36.0–46.0)
HCT: 32.6 % — ABNORMAL LOW (ref 36.0–46.0)
Hemoglobin: 12 g/dL (ref 12.0–15.0)
Hemoglobin: 11.3 g/dL — ABNORMAL LOW (ref 12.0–15.0)
MCH: 28.4 pg (ref 26.0–34.0)
MCH: 28.3 pg (ref 26.0–34.0)
MCHC: 35.1 g/dL (ref 30.0–36.0)
MCHC: 34.7 g/dL (ref 30.0–36.0)
MCV: 81 fL (ref 80.0–100.0)
MCV: 81.7 fL (ref 80.0–100.0)
Platelets: 294 10*3/uL (ref 150–400)
Platelets: 263 10*3/uL (ref 150–400)
RBC: 4.22 MIL/uL (ref 3.87–5.11)
RBC: 3.99 MIL/uL (ref 3.87–5.11)
RDW: 13.2 % (ref 11.5–15.5)
RDW: 13.2 % (ref 11.5–15.5)
WBC: 14.7 10*3/uL — ABNORMAL HIGH (ref 4.0–10.5)
WBC: 18.7 10*3/uL — ABNORMAL HIGH (ref 4.0–10.5)
nRBC: 0 % (ref 0.0–0.2)
nRBC: 0 % (ref 0.0–0.2)

## 2019-04-29 LAB — COMPREHENSIVE METABOLIC PANEL
ALT: 77 U/L — ABNORMAL HIGH (ref 0–44)
AST: 112 U/L — ABNORMAL HIGH (ref 15–41)
Albumin: 3.6 g/dL (ref 3.5–5.0)
Alkaline Phosphatase: 60 U/L (ref 38–126)
Anion gap: 6 (ref 5–15)
BUN: 10 mg/dL (ref 6–20)
CO2: 28 mmol/L (ref 22–32)
Calcium: 9.3 mg/dL (ref 8.9–10.3)
Chloride: 104 mmol/L (ref 98–111)
Creatinine, Ser: 1.12 mg/dL — ABNORMAL HIGH (ref 0.44–1.00)
GFR calc Af Amer: >60 mL/min (ref >60)
GFR calc non Af Amer: >60 mL/min (ref >60)
Glucose, Bld: 133 mg/dL — ABNORMAL HIGH (ref 70–99)
Potassium: 3.2 mmol/L — ABNORMAL LOW (ref 3.5–5.1)
Sodium: 138 mmol/L (ref 135–145)
Total Bilirubin: 0.4 mg/dL (ref 0.3–1.2)
Total Protein: 7.2 g/dL (ref 6.5–8.1)

## 2019-04-29 LAB — URINALYSIS, ROUTINE W REFLEX MICROSCOPIC
Bilirubin Urine: NEGATIVE
Glucose, UA: NEGATIVE mg/dL
Ketones, ur: NEGATIVE mg/dL
Leukocytes,Ua: NEGATIVE
Nitrite: NEGATIVE
Protein, ur: 100 mg/dL — AB
RBC / HPF: >50 RBC/hpf — ABNORMAL HIGH (ref 0–5)
Specific Gravity, Urine: >1.046 — ABNORMAL HIGH (ref 1.005–1.030)
pH: 6 (ref 5.0–8.0)

## 2019-04-29 LAB — I-STAT CHEM 8, ED
BUN: 11 mg/dL (ref 6–20)
Calcium, Ion: 1.27 mmol/L (ref 1.15–1.40)
Chloride: 104 mmol/L (ref 98–111)
Creatinine, Ser: 1 mg/dL (ref 0.44–1.00)
Glucose, Bld: 125 mg/dL — ABNORMAL HIGH (ref 70–99)
HCT: 37 % (ref 36.0–46.0)
Hemoglobin: 12.6 g/dL (ref 12.0–15.0)
Potassium: 3.3 mmol/L — ABNORMAL LOW (ref 3.5–5.1)
Sodium: 141 mmol/L (ref 135–145)
TCO2: 24 mmol/L (ref 22–32)

## 2019-04-29 LAB — LACTIC ACID, PLASMA: Lactic Acid, Venous: 2.1 mmol/L (ref 0.5–1.9)

## 2019-04-29 LAB — PROTIME-INR
INR: 1 (ref 0.8–1.2)
Prothrombin Time: 13.5 seconds (ref 11.4–15.2)

## 2019-04-29 LAB — HEMOGLOBIN AND HEMATOCRIT, BLOOD
HCT: 30.7 % — ABNORMAL LOW (ref 36.0–46.0)
HCT: 28.6 % — ABNORMAL LOW (ref 36.0–46.0)
Hemoglobin: 10.7 g/dL — ABNORMAL LOW (ref 12.0–15.0)
Hemoglobin: 10 g/dL — ABNORMAL LOW (ref 12.0–15.0)

## 2019-04-29 LAB — HIV ANTIBODY (ROUTINE TESTING W REFLEX): HIV Screen 4th Generation wRfx: NONREACTIVE

## 2019-04-29 LAB — ETHANOL: Alcohol, Ethyl (B): <10 mg/dL (ref <10)

## 2019-04-29 LAB — SAMPLE TO BLOOD BANK

## 2019-04-29 LAB — I-STAT BETA HCG BLOOD, ED (MC, WL, AP ONLY): I-stat hCG, quantitative: <5 m[IU]/mL (ref <5)

## 2019-04-29 LAB — SARS CORONAVIRUS 2 (TAT 6-24 HRS): SARS Coronavirus 2: NEGATIVE

## 2019-04-29 LAB — CDS SEROLOGY

## 2019-04-29 MED ORDER — PANTOPRAZOLE SODIUM 40 MG PO TBEC
40.0000 mg | DELAYED_RELEASE_TABLET | Freq: Every day | ORAL | Status: DC
  Administered 2019-04-30 – 2019-05-07 (×7): 40 mg via ORAL
  Filled 2019-04-29 (×7): qty 1

## 2019-04-29 MED ORDER — ONDANSETRON HCL 4 MG/2ML IJ SOLN
4.0000 mg | Freq: Four times a day (QID) | INTRAMUSCULAR | Status: DC | PRN
  Administered 2019-04-29 – 2019-05-04 (×3): 4 mg via INTRAVENOUS
  Filled 2019-04-29 (×4): qty 2

## 2019-04-29 MED ORDER — FENTANYL CITRATE (PF) 100 MCG/2ML IJ SOLN
50.0000 ug | Freq: Once | INTRAMUSCULAR | Status: AC
  Administered 2019-04-29: 50 ug via INTRAVENOUS
  Filled 2019-04-29: qty 2

## 2019-04-29 MED ORDER — OXYCODONE HCL 5 MG PO TABS
10.0000 mg | ORAL_TABLET | ORAL | Status: DC | PRN
  Administered 2019-04-29 – 2019-05-02 (×8): 10 mg via ORAL
  Filled 2019-04-29 (×8): qty 2

## 2019-04-29 MED ORDER — DEXTROSE-NACL 5-0.9 % IV SOLN
INTRAVENOUS | Status: DC
  Administered 2019-04-29 – 2019-05-06 (×7): via INTRAVENOUS

## 2019-04-29 MED ORDER — ONDANSETRON 4 MG PO TBDP: 4.0000 mg | ORAL_TABLET | Freq: Four times a day (QID) | ORAL | Status: DC | PRN

## 2019-04-29 MED ORDER — PANTOPRAZOLE SODIUM 40 MG IV SOLR
40.0000 mg | Freq: Every day | INTRAVENOUS | Status: DC
  Administered 2019-04-29 – 2019-05-05 (×2): 40 mg via INTRAVENOUS
  Filled 2019-04-29 (×2): qty 40

## 2019-04-29 MED ORDER — MORPHINE SULFATE (PF) 4 MG/ML IV SOLN
4.0000 mg | Freq: Once | INTRAVENOUS | Status: AC
  Administered 2019-04-29: 4 mg via INTRAVENOUS
  Filled 2019-04-29: qty 1

## 2019-04-29 MED ORDER — HYDROMORPHONE HCL 1 MG/ML IJ SOLN
1.0000 mg | INTRAMUSCULAR | Status: DC | PRN
  Administered 2019-04-29 – 2019-04-30 (×6): 1 mg via INTRAVENOUS
  Filled 2019-04-29 (×6): qty 1

## 2019-04-29 MED ORDER — IOHEXOL 300 MG/ML  SOLN
100.0000 mL | Freq: Once | INTRAMUSCULAR | Status: AC | PRN
  Administered 2019-04-29: 100 mL via INTRAVENOUS

## 2019-04-29 NOTE — ED Notes (Signed)
Ordered clear liquid diet
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
## 2019-04-29 NOTE — ED Notes (Signed)
Family at bedside. 
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
## 2019-04-30 ENCOUNTER — Inpatient Hospital Stay (HOSPITAL_COMMUNITY): Payer: Medicaid Other

## 2019-04-30 LAB — HEMOGLOBIN AND HEMATOCRIT, BLOOD
HCT: 26.5 % — ABNORMAL LOW (ref 36.0–46.0)
HCT: 23.9 % — ABNORMAL LOW (ref 36.0–46.0)
HCT: 23.3 % — ABNORMAL LOW (ref 36.0–46.0)
Hemoglobin: 9 g/dL — ABNORMAL LOW (ref 12.0–15.0)
Hemoglobin: 8.4 g/dL — ABNORMAL LOW (ref 12.0–15.0)
Hemoglobin: 8.3 g/dL — ABNORMAL LOW (ref 12.0–15.0)

## 2019-04-30 LAB — BASIC METABOLIC PANEL
Anion gap: 10 (ref 5–15)
BUN: 8 mg/dL (ref 6–20)
CO2: 24 mmol/L (ref 22–32)
Calcium: 8.6 mg/dL — ABNORMAL LOW (ref 8.9–10.3)
Chloride: 104 mmol/L (ref 98–111)
Creatinine, Ser: 0.85 mg/dL (ref 0.44–1.00)
GFR calc Af Amer: >60 mL/min (ref >60)
GFR calc non Af Amer: >60 mL/min (ref >60)
Glucose, Bld: 123 mg/dL — ABNORMAL HIGH (ref 70–99)
Potassium: 3.8 mmol/L (ref 3.5–5.1)
Sodium: 138 mmol/L (ref 135–145)

## 2019-04-30 MED ORDER — ACETAMINOPHEN 325 MG PO TABS
650.0000 mg | ORAL_TABLET | Freq: Four times a day (QID) | ORAL | Status: DC | PRN
  Administered 2019-04-30 – 2019-05-07 (×14): 650 mg via ORAL
  Filled 2019-04-30 (×17): qty 2

## 2019-04-30 MED ORDER — IOHEXOL 300 MG/ML  SOLN
125.0000 mL | Freq: Once | INTRAMUSCULAR | Status: AC | PRN
  Administered 2019-04-30: 14:00:00 100 mL via INTRAVENOUS
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
## 2019-04-30 NOTE — Progress Notes (Signed)
CT with contrast ordered. Allergy notification and confirmation with patient is that she is allergic to shrimp and had difficulty breathing, scratchy throat and has never had it again. No knowledge of contrast specific allergy. CT notified of this and stated contrast was okay. Explained to patient and to ask any questions when on CT. Medicated earlier for pain, IV infursing, vital signs stable. Temp 99.0 orally. Simmie Davies RN

## 2019-05-01 LAB — URINALYSIS, ROUTINE W REFLEX MICROSCOPIC
Bilirubin Urine: NEGATIVE
Glucose, UA: NEGATIVE mg/dL
Ketones, ur: NEGATIVE mg/dL
Leukocytes,Ua: NEGATIVE
Nitrite: NEGATIVE
Protein, ur: 100 mg/dL — AB
RBC / HPF: >50 RBC/hpf — ABNORMAL HIGH (ref 0–5)
Specific Gravity, Urine: 1.024 (ref 1.005–1.030)
pH: 6 (ref 5.0–8.0)

## 2019-05-01 LAB — CBC
HCT: 23.9 % — ABNORMAL LOW (ref 36.0–46.0)
Hemoglobin: 8.3 g/dL — ABNORMAL LOW (ref 12.0–15.0)
MCH: 28.1 pg (ref 26.0–34.0)
MCHC: 34.7 g/dL (ref 30.0–36.0)
MCV: 81 fL (ref 80.0–100.0)
Platelets: 199 10*3/uL (ref 150–400)
RBC: 2.95 MIL/uL — ABNORMAL LOW (ref 3.87–5.11)
RDW: 12.7 % (ref 11.5–15.5)
WBC: 13.6 10*3/uL — ABNORMAL HIGH (ref 4.0–10.5)
nRBC: 0 % (ref 0.0–0.2)

## 2019-05-01 LAB — HEMOGLOBIN AND HEMATOCRIT, BLOOD
HCT: 21.1 % — ABNORMAL LOW (ref 36.0–46.0)
Hemoglobin: 7.4 g/dL — ABNORMAL LOW (ref 12.0–15.0)

## 2019-05-01 MED ORDER — DOCUSATE SODIUM 100 MG PO CAPS
100.0000 mg | ORAL_CAPSULE | Freq: Every day | ORAL | Status: DC
  Administered 2019-05-01 – 2019-05-08 (×7): 100 mg via ORAL
  Filled 2019-05-01 (×7): qty 1
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
## 2019-05-02 ENCOUNTER — Inpatient Hospital Stay (HOSPITAL_COMMUNITY): Payer: Medicaid Other

## 2019-05-02 LAB — URINALYSIS, ROUTINE W REFLEX MICROSCOPIC

## 2019-05-02 LAB — CBC
HCT: 23.3 % — ABNORMAL LOW (ref 36.0–46.0)
Hemoglobin: 8.3 g/dL — ABNORMAL LOW (ref 12.0–15.0)
MCH: 28.3 pg (ref 26.0–34.0)
MCHC: 35.6 g/dL (ref 30.0–36.0)
MCV: 79.5 fL — ABNORMAL LOW (ref 80.0–100.0)
Platelets: 238 10*3/uL (ref 150–400)
RBC: 2.93 MIL/uL — ABNORMAL LOW (ref 3.87–5.11)
RDW: 12.6 % (ref 11.5–15.5)
WBC: 21 10*3/uL — ABNORMAL HIGH (ref 4.0–10.5)
nRBC: 0 % (ref 0.0–0.2)

## 2019-05-02 LAB — HEMOGLOBIN AND HEMATOCRIT, BLOOD
HCT: 21.3 % — ABNORMAL LOW (ref 36.0–46.0)
Hemoglobin: 7.3 g/dL — ABNORMAL LOW (ref 12.0–15.0)

## 2019-05-02 LAB — URINALYSIS, MICROSCOPIC (REFLEX): RBC / HPF: >50 RBC/hpf (ref 0–5)

## 2019-05-02 LAB — URINE CULTURE

## 2019-05-02 MED ORDER — HYDROMORPHONE HCL 1 MG/ML IJ SOLN: 1.0000 mg | INTRAMUSCULAR | Status: DC | PRN

## 2019-05-02 MED ORDER — CIPROFLOXACIN IN D5W 200 MG/100ML IV SOLN
200.0000 mg | Freq: Two times a day (BID) | INTRAVENOUS | Status: DC
  Administered 2019-05-02 – 2019-05-05 (×6): 200 mg via INTRAVENOUS
  Filled 2019-05-02 (×7): qty 100

## 2019-05-02 MED ORDER — POLYETHYLENE GLYCOL 3350 17 G PO PACK
17.0000 g | PACK | Freq: Every day | ORAL | Status: DC
  Administered 2019-05-02 – 2019-05-07 (×5): 17 g via ORAL
  Filled 2019-05-02 (×5): qty 1

## 2019-05-02 MED ORDER — OXYCODONE HCL 5 MG PO TABS
5.0000 mg | ORAL_TABLET | ORAL | Status: DC | PRN
  Administered 2019-05-02 – 2019-05-03 (×4): 10 mg via ORAL
  Administered 2019-05-03: 5 mg via ORAL
  Administered 2019-05-04 – 2019-05-08 (×8): 10 mg via ORAL
  Filled 2019-05-02 (×4): qty 2
  Filled 2019-05-02: qty 1
  Filled 2019-05-02 (×8): qty 2

## 2019-05-02 NOTE — Progress Notes (Signed)
Subjective: CC: right flank pain.  Hx:  Victoria Mclaughlin says her pain continues to improve. States she has not had any hematuria or pass any blood clots in past day. Voiding normally. Continued stable tachycardia and fever to 38.6 yesterday afternoon which then resolved.   Hgb 8.4 (9/20) --> 7.4 (9/21 am) --> 8.3 (9/21 pm) --> 7.3 today  Repeat CT on 9/20 showed an increase in size in the right perinephric hematoma which remains contained.  There is no urinary extravasation.  There remains a clot in the bladder on last CT scan.   Objective: Vital signs in last 24 hours: Temp:  [98.7 F (37.1 C)-101.4 F (38.6 C)] 98.9 F (37.2 C) (09/22 0319) Pulse Rate:  [102-120] 102 (09/22 0319) Resp:  [15-23] 17 (09/22 0245) BP: (115-149)/(70-99) 115/70 (09/22 0319) SpO2:  [94 %-100 %] 100 % (09/22 0319)  Intake/Output from previous day: 09/21 0701 - 09/22 0700 In: 1080 [P.O.:240; I.V.:840] Out: 500 [Urine:500] Intake/Output this shift: No intake/output data recorded.   Physical Exam Vitals signs reviewed.  Constitutional:      Appearance: Normal appearance.  Cardiovascular:     Rate and Rhythm: Tachycardia present.  Pulmonary:     Effort: Pulmonary effort is normal. No respiratory distress.  Abdominal:     General: Abdomen is flat.     Palpations: Abdomen is soft.     Tenderness: There is right CVA tenderness (but decreased).  Neurological:     Mental Status: She is alert.     Lab Results:  Recent Labs    05/01/19 1449 05/02/19 0213  WBC 13.6*  --   HGB 8.3* 7.3*  HCT 23.9* 21.3*  PLT 199  --    BMET Recent Labs    04/30/19 0232  NA 138  K 3.8  CL 104  CO2 24  GLUCOSE 123*  BUN 8  CREATININE 0.85  CALCIUM 8.6*   PT/INR No results for input(s): LABPROT, INR in the last 72 hours. ABG No results for input(s): PHART, HCO3 in the last 72 hours.  Invalid input(s): PCO2, PO2  Studies/Results: Ct Abdomen Pelvis W Contrast  Result Date: 04/30/2019 CLINICAL DATA:   Abdominal trauma EXAM: CT ABDOMEN AND PELVIS WITH CONTRAST TECHNIQUE: Multidetector CT imaging of the abdomen and pelvis was performed using the standard protocol following bolus administration of intravenous contrast. CONTRAST:  OMNIPAQUE IOHEXOL 300 MG/ML  SOLN COMPARISON:  04/29/2019 FINDINGS: Lower chest: There are new, right greater than left bilateral pleural effusions and associated atelectasis or consolidation. Hepatobiliary: No significant change in a grade III laceration of the posterior right lobe of the liver, involving hepatic segments VI and VII (series 4, image 19). Excreted contrast in the gallbladder. No gallstones, gallbladder wall thickening, or biliary dilatation. Pancreas: Unremarkable. No pancreatic ductal dilatation or surrounding inflammatory changes. Spleen: Normal in size without significant abnormality. Adrenals/Urinary Tract: Adrenal glands are unremarkable. Redemonstrated grade III laceration of the inferior right pole of the kidney, with enlargement of hematoma, measuring approximately 8.1 x 6.8 x 5.6 cm, previously 6.2 x 5.6 x 5.2 cm when measured similarly (series 4, image 39, series 7, image 56). There is no overt contrast leak from the collecting system. Redemonstrated slightly hypodense filling defect of the dependent urinary bladder, likely clot (series 4, image 79). Stomach/Bowel: Stomach is within normal limits. Appendix appears normal. No evidence of bowel wall thickening, distention, or inflammatory changes. Vascular/Lymphatic: No significant vascular findings are present. No enlarged abdominal or pelvic lymph nodes. Reproductive: No mass or other significant  abnormality. Other: No abdominal wall hernia or abnormality. Interval increase in retroperitoneal hematoma and fat stranding (series 4, image 44). Unchanged small volume four quadrant ascites. Musculoskeletal: No acute or significant osseous findings. IMPRESSION: 1. There are new, right greater than left bilateral  pleural effusions and associated atelectasis or consolidation. 2. No significant change in a grade III laceration of the posterior right lobe of the liver, involving hepatic segments VI and VII (series 4, image 19). 3. Redemonstrated grade III laceration of the inferior right pole of the kidney, with enlargement of hematoma, measuring approximately 8.1 x 6.8 x 5.6 cm, previously 6.2 x 5.6 x 5.2 cm when measured similarly (series 4, image 39, series 7, image 56). There is no overt contrast leak from the collecting system. 4. Interval increase in retroperitoneal hematoma and fat stranding (series 4, image 44). 5.  Unchanged small volume ascites. 6. Redemonstrated slightly hypodense filling defect of the dependent urinary bladder, likely clot (series 4, image 79). Electronically Signed   By: Eddie Candle M.D.   On: 04/30/2019 15:48     Assessment and Plan: Right renal laceration without urinary extravasation but with expanding hematoma on last CT scan on 9/20. However, her pain is improving, Hgb is low but relatively stable without transfusion requirement yet, and she is HDS.     I would recommend transfusion as indicated and if the hgb continues decline or she becomes more hemodynamically unstable to consult IR for consideration of renal arteriogram with embolization.  If she continues to improve clinically and Hgb remains stable then continued conservative management is advisable.   Gross hematuria resolved with no more bladder clots in voided urine.  She is voiding without diffculty so no intervention is needed at this time.      LOS: 3 days    Haskel Schroeder 05/02/2019 [phone] ID: Victoria Mclaughlin, female   DOB: 08/27/1985, 33 y.o.   MRN: [id number]
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
## 2019-05-02 NOTE — Progress Notes (Signed)
Sterile In and Out Cath for UA and Urine culture. Pt tolerated well. Blood cultures completed. IV assessed to be leaking prior to staring Antibiotics ordered after cultures obtained. IV team consulted ASAP for new peripheral line to begin antibiotic. Pt states she feels "very tired, but okay". Temp was down to 99.9 after Tylenol earlier today, but after cultures obtained, temp was 101.2. IS done and patient educated on need to do this every hour. Still low ST. Alert and oriented. OOB to chair earlier. Tolerated I&O cath well. Urine was dark bloody brown/red. Cloudy. Instructed on how to obtain sputum and specimen container labeled and in room.  Simmie Davies RN

## 2019-05-03 ENCOUNTER — Inpatient Hospital Stay (HOSPITAL_COMMUNITY): Payer: Medicaid Other

## 2019-05-03 LAB — CBC
HCT: 21.3 % — ABNORMAL LOW (ref 36.0–46.0)
Hemoglobin: 7.3 g/dL — ABNORMAL LOW (ref 12.0–15.0)
MCH: 27.3 pg (ref 26.0–34.0)
MCHC: 34.3 g/dL (ref 30.0–36.0)
MCV: 79.8 fL — ABNORMAL LOW (ref 80.0–100.0)
Platelets: 261 10*3/uL (ref 150–400)
RBC: 2.67 MIL/uL — ABNORMAL LOW (ref 3.87–5.11)
RDW: 12.5 % (ref 11.5–15.5)
WBC: 19.6 10*3/uL — ABNORMAL HIGH (ref 4.0–10.5)
nRBC: 0 % (ref 0.0–0.2)

## 2019-05-03 LAB — URINE CULTURE: Culture: NO GROWTH

## 2019-05-03 LAB — MRSA PCR SCREENING: MRSA by PCR: NEGATIVE

## 2019-05-03 MED ORDER — IOHEXOL 300 MG/ML  SOLN
100.0000 mL | Freq: Once | INTRAMUSCULAR | Status: AC | PRN
  Administered 2019-05-03: 100 mL via INTRAVENOUS

## 2019-05-03 MED ORDER — IOHEXOL 300 MG/ML  SOLN: 100.0000 mL | Freq: Once | INTRAMUSCULAR | Status: DC

## 2019-05-03 NOTE — Progress Notes (Signed)
Paged urology oncall. Pt having urgency, voiding every 5 mins, with minimal output. Pt states that every time she takes a sip of drink she has to pee. Pt also complaining of pain in abd area. Total output for shift: 148ml with 1 unmeasured occurrence. Pt bladder scanned with >439ml shown. Order to place foley catheter. Will follow through and continue to monitor.
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
## 2019-05-03 NOTE — Plan of Care (Signed)
  Problem: Elimination: Goal: Will not experience complications related to urinary retention Description: Indwelling urinary foley catheter inserted per MD order. Foley was inserted under sterile technique, urine return noted. The catheter ballon was inflated to 46mLs. Patient educated on peri care twice a day, daily CHG baths, and preventative measures for infection. Will continue to monitor urinary output.  Outcome: Progressing
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
## 2019-05-03 NOTE — Progress Notes (Signed)
OT Cancellation Note  Patient Details Name: LEILANNI HALVORSON MRN: [id number] DOB: [birth year]   Cancelled Treatment:    Reason Eval/Treat Not Completed: Patient at procedure or test/ unavailable(Pt getting updated CT. Will return as schedule allows.)  Gus Rankin 05/03/2019, 11:30 AM

## 2019-05-04 ENCOUNTER — Inpatient Hospital Stay (HOSPITAL_COMMUNITY): Payer: Medicaid Other

## 2019-05-04 LAB — BASIC METABOLIC PANEL
Anion gap: 11 (ref 5–15)
BUN: 6 mg/dL (ref 6–20)
CO2: 27 mmol/L (ref 22–32)
Calcium: 8.3 mg/dL — ABNORMAL LOW (ref 8.9–10.3)
Chloride: 96 mmol/L — ABNORMAL LOW (ref 98–111)
Creatinine, Ser: 0.84 mg/dL (ref 0.44–1.00)
GFR calc Af Amer: >60 mL/min (ref >60)
GFR calc non Af Amer: >60 mL/min (ref >60)
Glucose, Bld: 127 mg/dL — ABNORMAL HIGH (ref 70–99)
Potassium: 2.9 mmol/L — ABNORMAL LOW (ref 3.5–5.1)
Sodium: 134 mmol/L — ABNORMAL LOW (ref 135–145)

## 2019-05-04 LAB — CBC
HCT: 20 % — ABNORMAL LOW (ref 36.0–46.0)
Hemoglobin: 7.1 g/dL — ABNORMAL LOW (ref 12.0–15.0)
MCH: 28 pg (ref 26.0–34.0)
MCHC: 35.5 g/dL (ref 30.0–36.0)
MCV: 78.7 fL — ABNORMAL LOW (ref 80.0–100.0)
Platelets: 294 10*3/uL (ref 150–400)
RBC: 2.54 MIL/uL — ABNORMAL LOW (ref 3.87–5.11)
RDW: 12.8 % (ref 11.5–15.5)
WBC: 12 10*3/uL — ABNORMAL HIGH (ref 4.0–10.5)
nRBC: 0 % (ref 0.0–0.2)

## 2019-05-04 MED ORDER — POTASSIUM CHLORIDE CRYS ER 20 MEQ PO TBCR
40.0000 meq | EXTENDED_RELEASE_TABLET | Freq: Two times a day (BID) | ORAL | Status: AC
  Administered 2019-05-04 (×2): 40 meq via ORAL
  Filled 2019-05-04 (×2): qty 2

## 2019-05-04 MED ORDER — CHLORHEXIDINE GLUCONATE CLOTH 2 % EX PADS
6.0000 | MEDICATED_PAD | Freq: Every day | CUTANEOUS | Status: DC
  Administered 2019-05-04 – 2019-05-08 (×4): 6 via TOPICAL

## 2019-05-04 MED ORDER — FERROUS SULFATE 325 (65 FE) MG PO TABS
325.0000 mg | ORAL_TABLET | Freq: Every day | ORAL | Status: DC
  Administered 2019-05-06 – 2019-05-08 (×3): 325 mg via ORAL
  Filled 2019-05-04 (×3): qty 1

## 2019-05-04 NOTE — Progress Notes (Signed)
Subjective: CC: right flank pain.  Hx: Victoria Mclaughlin continues to have some right flank pain and remains intermittently febrile and tachycardic but her leukocytosis has improved.  Her Cr remains normal.  CT on 9/23 showed further expansion of the hematoma that was characterized as minimal.  She has increased intraabdominal fluid and a right pleural effusion.  There was a clot present in the bladder and she developed retention possibly from obstructing clots and has a foley that is draining light pink urine.   ROS:  Review of Systems  Constitutional: Positive for fever and malaise/fatigue.  Respiratory: Negative for shortness of breath.   Cardiovascular: Negative for chest pain.  Genitourinary: Positive for flank pain and hematuria.    Anti-infectives: Anti-infectives (From admission, onward)   Start     Dose/Rate Route Frequency Ordered Stop   05/02/19 1700  ciprofloxacin (CIPRO) IVPB 200 mg     200 mg 100 mL/hr over 60 Minutes Intravenous Every 12 hours 05/02/19 1549        Current Facility-Administered Medications  Medication Dose Route Frequency Provider Last Rate Last Dose  . acetaminophen (TYLENOL) tablet 650 mg  650 mg Oral Q6H PRN Ralene Ok, MD   650 mg at 05/04/19 0511  . Chlorhexidine Gluconate Cloth 2 % PADS 6 each  6 each Topical Daily Georganna Skeans, MD   6 each at 05/04/19 0940  . ciprofloxacin (CIPRO) IVPB 200 mg  200 mg Intravenous Q12H Focht, Jessica L, PA 100 mL/hr at 05/04/19 1711 200 mg at 05/04/19 1711  . dextrose 5 %-0.9 % sodium chloride infusion   Intravenous Continuous Focht, Jessica L, PA 75 mL/hr at 05/01/19 1133    . docusate sodium (COLACE) capsule 100 mg  100 mg Oral Daily Focht, Jessica L, PA   100 mg at 05/04/19 0932  . [START ON 05/05/2019] ferrous sulfate tablet 325 mg  325 mg Oral Q breakfast Saverio Danker, PA-C      . HYDROmorphone (DILAUDID) injection 1 mg  1 mg Intravenous Q4H PRN Focht, Jessica L, PA      . iohexol (OMNIPAQUE) 300 MG/ML  solution 100 mL  100 mL Intravenous Once Focht, Jessica L, PA      . ondansetron (ZOFRAN-ODT) disintegrating tablet 4 mg  4 mg Oral Q6H PRN Ralene Ok, MD       Or  . ondansetron [facility]) injection 4 mg  4 mg Intravenous Q6H PRN Ralene Ok, MD   4 mg at 05/04/19 0939  . oxyCODONE (Oxy IR/ROXICODONE) immediate release tablet 5-10 mg  5-10 mg Oral Q4H PRN Focht, Jessica L, PA   10 mg at 05/04/19 0932  . pantoprazole (PROTONIX) EC tablet 40 mg  40 mg Oral Daily Ralene Ok, MD   40 mg at 05/04/19 0932   Or  . pantoprazole (PROTONIX) injection 40 mg  40 mg Intravenous Daily Ralene Ok, MD   40 mg at 04/29/19 1044  . polyethylene glycol (MIRALAX / GLYCOLAX) packet 17 g  17 g Oral Daily Focht, Jessica L, PA   17 g at 05/04/19 0932  . potassium chloride SA (K-DUR) CR tablet 40 mEq  40 mEq Oral BID Saverio Danker, PA-C   40 mEq at 05/04/19 1307     Objective: Vital signs in last 24 hours: Temp:  [98.2 F (36.8 C)-101.3 F (38.5 C)] 101.3 F (38.5 C) (09/24 1650) Pulse Rate:  [96-117] 115 (09/24 1650) Resp:  [11-21] 18 (09/24 1650) BP: (112-129)/(69-81) 129/81 (09/24 1650) SpO2:  [91 %-100 %] 97 % (  09/24 1650)  Intake/Output from previous day: 09/23 0701 - 09/24 0700 In: 240 [P.O.:240] Out: 1200 [Urine:1200] Intake/Output this shift: Total I/O In: -  Out: 700 [Urine:700]   Physical Exam Vitals signs reviewed.  Constitutional:      Appearance: Normal appearance.  Cardiovascular:     Rate and Rhythm: Tachycardia present.  Pulmonary:     Effort: Pulmonary effort is normal. No respiratory distress.  Genitourinary:    Comments: Urine light pink in foley bag.  Neurological:     General: No focal deficit present.     Mental Status: She is alert and oriented to person, place, and time.     Lab Results:  Recent Labs    05/03/19 0220 05/04/19 0959  WBC 19.6* 12.0*  HGB 7.3* 7.1*  HCT 21.3* 20.0*  PLT 261 294   BMET Recent Labs    05/04/19 0959  NA  134*  K 2.9*  CL 96*  CO2 27  GLUCOSE 127*  BUN 6  CREATININE 0.84  CALCIUM 8.3*   PT/INR No results for input(s): LABPROT, INR in the last 72 hours. ABG No results for input(s): PHART, HCO3 in the last 72 hours.  Invalid input(s): PCO2, PO2  Studies/Results: Ct Abdomen Pelvis W Contrast  Result Date: 05/03/2019 CLINICAL DATA:  Blunt abdominal trauma.  Macroscopic hematuria. EXAM: CT ABDOMEN AND PELVIS WITH CONTRAST TECHNIQUE: Multidetector CT imaging of the abdomen and pelvis was performed using the standard protocol following bolus administration of intravenous contrast. CONTRAST:  OMNIPAQUE IOHEXOL 300 MG/ML  SOLN COMPARISON:  CT scan of April 30, 2019. FINDINGS: Lower chest: Moderate right pleural effusion is noted with adjacent atelectasis of the right lower lobe. Hepatobiliary: No gallstones or biliary dilatation is noted. Stable appearance of hepatic laceration seen involving posterior segment of right hepatic lobe. Mild amount of fluid is noted around the liver. Pancreas: Unremarkable. No pancreatic ductal dilatation or surrounding inflammatory changes. Spleen: Normal in size without focal abnormality. Moderate amount of perisplenic fluid is noted which is increased compared to prior exam. Adrenals/Urinary Tract: Adrenal glands are unremarkable. Left kidney and ureter are unremarkable. There is again noted a large laceration involving the lower pole the right kidney that extends into the pelvis, resulting in large pararenal hematoma which is slightly enlarged compared to prior exam. No definite active extravasation is noted at this time. There is also noted a smaller laceration involving the upper pole. It is felt this is most consistent with a grade 4 renal laceration. There is noted hemorrhage in the dependent portion of the urinary bladder which may have been present on prior exam. Stomach/Bowel: The stomach appears normal. There is no evidence of bowel obstruction or  inflammation. Vascular/Lymphatic: Abdominal aorta is unremarkable. No definite adenopathy is noted. IVC is unremarkable. Dilated ovarian veins are noted. Reproductive: Uterus and ovaries unremarkable. Other: Continued presence of fluid in the in the pelvis and pericolic gutters which appears to be slightly enlarged compared to prior exam. Musculoskeletal: No acute or significant osseous findings. IMPRESSION: Stable large laceration is seen involving the lower pole the right kidney which extends into the renal pelvis, and results in large right pararenal hematoma which may be slightly enlarged compared to prior exam. No definite extravasation is seen at this time. Smaller laceration is seen involving the upper pole of the right kidney is well. It is felt that this is most consistent with a grade for renal laceration. There is noted hemorrhage in the dependent portion of the urinary bladder which  may have been present on the prior exam. Stable laceration seen involving the posterior segment of the right hepatic lobe. Mildly increased amount of free fluid is noted in the pelvis and pericolic gutters bilaterally. Increased amount of fluid is noted around the spleen. Moderate right pleural effusion is noted with adjacent atelectasis of the right lower lobe. Electronically Signed   By: Lupita RaiderJames  Green Jr M.D.   On: 05/03/2019 14:42   Dg Chest Port 1 View  Result Date: 05/04/2019 CLINICAL DATA:  Fever, shortness of breath. EXAM: PORTABLE CHEST 1 VIEW COMPARISON:  Radiograph of May 02, 2019. FINDINGS: The heart size and mediastinal contours are within normal limits. No pneumothorax is noted. Left lung is clear. Stable mild to moderate right pleural effusion is noted with associated atelectasis or infiltrate. The visualized skeletal structures are unremarkable. IMPRESSION: Stable mild to moderate right pleural effusion is noted with associated right basilar atelectasis or infiltrate. Electronically Signed   By: Lupita RaiderJames   Green Jr M.D.   On: 05/04/2019 10:23   I have reviewed the CT films and the CXR report along with the recent labs.  I have discussed the case with Dr. Cliffton AstersWhite.   Assessment and Plan: Right renal Laceration with slowly expanding hematoma and gradual decline in the Hgb.   She is declining transfusion because of religious belief so I think it would be worthwhile to make her NPO and if the Hgb continues to decline overnight, have IR assess for consideration of arteriogram and possible embolization.    Clot retention.  Foley in place and draining, but I think it would be wise to irrigate the foley to insure no significant clots.        LOS: 5 days    Bjorn PippinJohn Haadi Santellan 05/04/2019 [phone] ID: Victoria CarrelSharine A Elsen, female   DOB: 03/17/1986, 33 y.o.   MRN: [id number]
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
## 2019-05-05 ENCOUNTER — Inpatient Hospital Stay (HOSPITAL_COMMUNITY): Payer: Medicaid Other

## 2019-05-05 ENCOUNTER — Encounter (HOSPITAL_COMMUNITY): Payer: Self-pay

## 2019-05-05 HISTORY — PX: IR US GUIDE VASC ACCESS RIGHT: IMG2390

## 2019-05-05 HISTORY — PX: IR RENAL SELECTIVE  UNI INC S&I MOD SED: IMG654

## 2019-05-05 LAB — CBC
HCT: 18.3 % — ABNORMAL LOW (ref 36.0–46.0)
Hemoglobin: 6.5 g/dL — CL (ref 12.0–15.0)
MCH: 28.1 pg (ref 26.0–34.0)
MCHC: 35.5 g/dL (ref 30.0–36.0)
MCV: 79.2 fL — ABNORMAL LOW (ref 80.0–100.0)
Platelets: 309 10*3/uL (ref 150–400)
RBC: 2.31 MIL/uL — ABNORMAL LOW (ref 3.87–5.11)
RDW: 13.1 % (ref 11.5–15.5)
WBC: 9.4 10*3/uL (ref 4.0–10.5)
nRBC: 0 % (ref 0.0–0.2)

## 2019-05-05 LAB — BASIC METABOLIC PANEL
Anion gap: 8 (ref 5–15)
BUN: <5 mg/dL — ABNORMAL LOW (ref 6–20)
CO2: 27 mmol/L (ref 22–32)
Calcium: 8.2 mg/dL — ABNORMAL LOW (ref 8.9–10.3)
Chloride: 101 mmol/L (ref 98–111)
Creatinine, Ser: 0.76 mg/dL (ref 0.44–1.00)
GFR calc Af Amer: >60 mL/min (ref >60)
GFR calc non Af Amer: >60 mL/min (ref >60)
Glucose, Bld: 105 mg/dL — ABNORMAL HIGH (ref 70–99)
Potassium: 3.5 mmol/L (ref 3.5–5.1)
Sodium: 136 mmol/L (ref 135–145)

## 2019-05-05 MED ORDER — MIDAZOLAM HCL 2 MG/2ML IJ SOLN
INTRAMUSCULAR | Status: AC
  Filled 2019-05-05: qty 2

## 2019-05-05 MED ORDER — IOHEXOL 300 MG/ML  SOLN
150.0000 mL | Freq: Once | INTRAMUSCULAR | Status: AC | PRN
  Administered 2019-05-05: 25 mL via INTRA_ARTERIAL

## 2019-05-05 MED ORDER — IOHEXOL 300 MG/ML  SOLN
150.0000 mL | Freq: Once | INTRAMUSCULAR | Status: AC | PRN
  Administered 2019-05-05: 15:00:00 40 mL via INTRA_ARTERIAL

## 2019-05-05 MED ORDER — KETOROLAC TROMETHAMINE 30 MG/ML IJ SOLN
INTRAMUSCULAR | Status: AC
  Filled 2019-05-05: qty 1

## 2019-05-05 MED ORDER — KETOROLAC TROMETHAMINE 30 MG/ML IJ SOLN
INTRAMUSCULAR | Status: AC | PRN
  Administered 2019-05-05: 30 mg via INTRAVENOUS

## 2019-05-05 MED ORDER — LIDOCAINE HCL 1 % IJ SOLN
INTRAMUSCULAR | Status: AC | PRN
  Administered 2019-05-05: 10 mL

## 2019-05-05 MED ORDER — FENTANYL CITRATE (PF) 100 MCG/2ML IJ SOLN
INTRAMUSCULAR | Status: AC
  Filled 2019-05-05: qty 2

## 2019-05-05 MED ORDER — KETOROLAC TROMETHAMINE 30 MG/ML IJ SOLN: 30.0000 mg | Freq: Once | INTRAMUSCULAR | Status: AC

## 2019-05-05 MED ORDER — MIDAZOLAM HCL 2 MG/2ML IJ SOLN
INTRAMUSCULAR | Status: AC | PRN
  Administered 2019-05-05 (×2): 0.5 mg via INTRAVENOUS

## 2019-05-05 MED ORDER — FENTANYL CITRATE (PF) 100 MCG/2ML IJ SOLN
INTRAMUSCULAR | Status: AC | PRN
  Administered 2019-05-05: 25 ug via INTRAVENOUS
  Administered 2019-05-05: 50 ug via INTRAVENOUS

## 2019-05-05 MED ORDER — LIDOCAINE HCL 1 % IJ SOLN
INTRAMUSCULAR | Status: AC
  Filled 2019-05-05: qty 20

## 2019-05-05 NOTE — Sedation Documentation (Signed)
21fr exoseal to right groin site, sheath pulled, IR Tech holding pressure

## 2019-05-05 NOTE — Progress Notes (Signed)
Subjective: CC: Right flank pain.  Hx: Victoria Mclaughlin continues to have mild right flank pain although this is improved compared to prior days.  She had one low-grade fever yesterday that has since resolved. Cr remains normal.  CT on 9/23 showed further expansion of the hematoma that was characterized as minimal.  She has increased intraabdominal fluid and a right pleural effusion.  There was a clot present in the bladder and she developed retention possibly from obstructing clots and has a foley that is draining light pink urine.    This morning, on 05/05/2019, bladder scan was obtained with Foley catheter in place.  This demonstrated 146 cc that was felt to represent blood clot.  Therefore, catheter was upsized to 20 French catheter and new catheter was irrigated by urology MD at bedside this morning.  This did demonstrate notable old blood clot.  Catheter was irrigated to clear.  There not did not appear to be any active bleeding, and urine in tubing after irrigation was faint pink-tinged.  Hemoglobin downtrended again on 9/25 to 6.5   Anti-infectives: Anti-infectives (From admission, onward)   Start     Dose/Rate Route Frequency Ordered Stop   05/02/19 1700  ciprofloxacin (CIPRO) IVPB 200 mg     200 mg 100 mL/hr over 60 Minutes Intravenous Every 12 hours 05/02/19 1549        Current Facility-Administered Medications  Medication Dose Route Frequency Provider Last Rate Last Dose  . acetaminophen (TYLENOL) tablet 650 mg  650 mg Oral Q6H PRN Ralene Ok, MD   650 mg at 05/04/19 2325  . Chlorhexidine Gluconate Cloth 2 % PADS 6 each  6 each Topical Daily Georganna Skeans, MD   6 each at 05/04/19 0940  . ciprofloxacin (CIPRO) IVPB 200 mg  200 mg Intravenous Q12H Focht, Jessica L, PA 100 mL/hr at 05/05/19 0417 200 mg at 05/05/19 0417  . dextrose 5 %-0.9 % sodium chloride infusion   Intravenous Continuous Focht, Jessica L, PA 75 mL/hr at 05/01/19 1133    . docusate sodium (COLACE) capsule 100 mg   100 mg Oral Daily Focht, Jessica L, PA   100 mg at 05/04/19 0932  . ferrous sulfate tablet 325 mg  325 mg Oral Q breakfast Saverio Danker, PA-C      . HYDROmorphone (DILAUDID) injection 1 mg  1 mg Intravenous Q4H PRN Focht, Jessica L, PA      . iohexol (OMNIPAQUE) 300 MG/ML solution 100 mL  100 mL Intravenous Once Focht, Jessica L, PA      . ondansetron (ZOFRAN-ODT) disintegrating tablet 4 mg  4 mg Oral Q6H PRN Ralene Ok, MD       Or  . ondansetron [facility]) injection 4 mg  4 mg Intravenous Q6H PRN Ralene Ok, MD   4 mg at 05/04/19 0939  . oxyCODONE (Oxy IR/ROXICODONE) immediate release tablet 5-10 mg  5-10 mg Oral Q4H PRN Focht, Jessica L, PA   10 mg at 05/04/19 1956  . pantoprazole (PROTONIX) EC tablet 40 mg  40 mg Oral Daily Ralene Ok, MD   40 mg at 05/04/19 0932   Or  . pantoprazole (PROTONIX) injection 40 mg  40 mg Intravenous Daily Ralene Ok, MD   40 mg at 04/29/19 1044  . polyethylene glycol (MIRALAX / GLYCOLAX) packet 17 g  17 g Oral Daily Focht, Jessica L, PA   17 g at 05/04/19 0932     Objective: Vital signs in last 24 hours: Temp:  [98.2 F (36.8 C)-101.3 F (38.5  C)] 99 F (37.2 C) (09/25 0514) Pulse Rate:  [95-115] 95 (09/25 0514) Resp:  [12-21] 16 (09/25 0514) BP: (112-129)/(68-81) 115/77 (09/25 0514) SpO2:  [95 %-100 %] 100 % (09/24 2017)  Intake/Output from previous day: 09/24 0701 - 09/25 0700 In: -  Out: 2225 [Urine:2225] Intake/Output this shift: No intake/output data recorded.   Physical Exam Vitals signs reviewed.  Constitutional:      Appearance: Normal appearance.  Cardiovascular:     Rate and Rhythm: Tachycardia present.  Pulmonary:     Effort: Pulmonary effort is normal. No respiratory distress.  Genitourinary:    Comments: Urine light pink in foley bag.  Neurological:     General: No focal deficit present.     Mental Status: She is alert and oriented to person, place, and time.     Lab Results:  Recent Labs     05/03/19 0220 05/04/19 0959  WBC 19.6* 12.0*  HGB 7.3* 7.1*  HCT 21.3* 20.0*  PLT 261 294   BMET Recent Labs    05/04/19 0959 05/05/19 0632  NA 134* 136  K 2.9* 3.5  CL 96* 101  CO2 27 27  GLUCOSE 127* 105*  BUN 6 <5*  CREATININE 0.84 0.76  CALCIUM 8.3* 8.2*   PT/INR No results for input(s): LABPROT, INR in the last 72 hours. ABG No results for input(s): PHART, HCO3 in the last 72 hours.  Invalid input(s): PCO2, PO2  Studies/Results: Ct Abdomen Pelvis W Contrast  Result Date: 05/03/2019 CLINICAL DATA:  Blunt abdominal trauma.  Macroscopic hematuria. EXAM: CT ABDOMEN AND PELVIS WITH CONTRAST TECHNIQUE: Multidetector CT imaging of the abdomen and pelvis was performed using the standard protocol following bolus administration of intravenous contrast. CONTRAST:  100mL OMNIPAQUE IOHEXOL 300 MG/ML  SOLN COMPARISON:  CT scan of April 30, 2019. FINDINGS: Lower chest: Moderate right pleural effusion is noted with adjacent atelectasis of the right lower lobe. Hepatobiliary: No gallstones or biliary dilatation is noted. Stable appearance of hepatic laceration seen involving posterior segment of right hepatic lobe. Mild amount of fluid is noted around the liver. Pancreas: Unremarkable. No pancreatic ductal dilatation or surrounding inflammatory changes. Spleen: Normal in size without focal abnormality. Moderate amount of perisplenic fluid is noted which is increased compared to prior exam. Adrenals/Urinary Tract: Adrenal glands are unremarkable. Left kidney and ureter are unremarkable. There is again noted a large laceration involving the lower pole the right kidney that extends into the pelvis, resulting in large pararenal hematoma which is slightly enlarged compared to prior exam. No definite active extravasation is noted at this time. There is also noted a smaller laceration involving the upper pole. It is felt this is most consistent with a grade 4 renal laceration. There is noted  hemorrhage in the dependent portion of the urinary bladder which may have been present on prior exam. Stomach/Bowel: The stomach appears normal. There is no evidence of bowel obstruction or inflammation. Vascular/Lymphatic: Abdominal aorta is unremarkable. No definite adenopathy is noted. IVC is unremarkable. Dilated ovarian veins are noted. Reproductive: Uterus and ovaries unremarkable. Other: Continued presence of fluid in the in the pelvis and pericolic gutters which appears to be slightly enlarged compared to prior exam. Musculoskeletal: No acute or significant osseous findings. IMPRESSION: Stable large laceration is seen involving the lower pole the right kidney which extends into the renal pelvis, and results in large right pararenal hematoma which may be slightly enlarged compared to prior exam. No definite extravasation is seen at this time. Smaller laceration is  seen involving the upper pole of the right kidney is well. It is felt that this is most consistent with a grade for renal laceration. There is noted hemorrhage in the dependent portion of the urinary bladder which may have been present on the prior exam. Stable laceration seen involving the posterior segment of the right hepatic lobe. Mildly increased amount of free fluid is noted in the pelvis and pericolic gutters bilaterally. Increased amount of fluid is noted around the spleen. Moderate right pleural effusion is noted with adjacent atelectasis of the right lower lobe. Electronically Signed   By: Lupita Raider M.D.   On: 05/03/2019 14:42   Dg Chest Port 1 View  Result Date: 05/04/2019 CLINICAL DATA:  Fever, shortness of breath. EXAM: PORTABLE CHEST 1 VIEW COMPARISON:  Radiograph of May 02, 2019. FINDINGS: The heart size and mediastinal contours are within normal limits. No pneumothorax is noted. Left lung is clear. Stable mild to moderate right pleural effusion is noted with associated atelectasis or infiltrate. The visualized  skeletal structures are unremarkable. IMPRESSION: Stable mild to moderate right pleural effusion is noted with associated right basilar atelectasis or infiltrate. Electronically Signed   By: Lupita Raider M.D.   On: 05/04/2019 10:23   I have reviewed the CT films and the CXR report along with the recent labs.  I have discussed the case with Dr. Cliffton Asters.   Assessment and Plan: Right renal laceration with slowly expanding hematoma and gradual decline in the Hgb. Hgb now 6.5 on 9/25.   Please consult IR for embolization given continued decline in hemoglobin and expanding hematoma concerning for continued active bleeding.   Clot retention:  Foley in place and draining, upsized to 20 [place] on 9/25 and urology MD irrigated old clot until urine clear.  Recommend nursing to irrigate bladder as needed if hematuria or clots or high bladder scan is noted.     LOS: 6 days    Roxanne Gates 05/05/2019 [phone] ID: Victoria Mclaughlin, female   DOB: Jul 10, 1986, 33 y.o.   MRN: [id number]

## 2019-05-05 NOTE — Sedation Documentation (Signed)
Notified Dr Kathlene Cote of temp 101.6*F
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
## 2019-05-05 NOTE — Progress Notes (Signed)
Chaplain visited with patient as a result of progression rounds.  The patient expressed frustration with the nursing staff and a general fear for their well-being while staying at the hospital.  The chaplain provided supportive conversation as well as emotional support.  The chaplain also provided a brief theological consult regarding the patients stated beliefs.  Brion Aliment Chaplain Resident For questions concerning this note please contact me by pager [phone]

## 2019-05-05 NOTE — Progress Notes (Signed)
PT Cancellation Note  Patient Details Name: INAAYA VELLUCCI MRN: [id number] DOB: September 16, 1985   Cancelled Treatment:    Reason Eval/Treat Not Completed: Medical issues which prohibited therapy; patient on bedrest after her procedure and with hemoglobin of 6.5.  Will attempt again another day.    Reginia Naas 05/05/2019, 4:34 PM  Magda Kiel, [place] [phone] 05/05/2019

## 2019-05-05 NOTE — Progress Notes (Signed)
OT Cancellation Note  Patient Details Name: Victoria Mclaughlin MRN: [id number] DOB: [birth year]   Cancelled Treatment:    Reason Eval/Treat Not Completed: Medical issues which prohibited therapy. Hgb 6.5; Will check back as time allows.   [name], COTA/L Acute Rehabilitation Services [place] 05/05/2019, 9:14 AM

## 2019-05-05 NOTE — Sedation Documentation (Signed)
Pt laying with eyes open, asked patient if in pain, patient replied "no.Marland KitchenMarland KitchenI am comfortable at this time".  Will cont to monitor

## 2019-05-05 NOTE — Sedation Documentation (Signed)
Dressing applied to right groin site by IR Tech 

## 2019-05-05 NOTE — Procedures (Signed)
Interventional Radiology Procedure Note  Procedure: Selective right renal arteriography of 2 separate right renal arteries  Complications: None  Estimated Blood Loss: < 10 mL  Findings: Dominant right renal artery supplies most of right kidney. Arteriography shows no evidence of arterial injury, contrast extravasation, dissection or pseudoaneurysm.  LP nephrographic defect caused suspicion of separate LP supply.  Aortogram revealed second right renal artery in close proximity to main artery supplying medial lower pole. Selective arteriography of this artery shows no arterial injury, extravasation or focal injury to target for embolization.  No focal target to embolize by arteriographic/transcatheter means.  Venetia Night. Kathlene Cote, M.D Pager:  [phone]

## 2019-05-05 NOTE — Consult Note (Signed)
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  None. FINDINGS: The heart size and mediastinal contours are within normal limits. Both lungs are clear. The visualized skeletal structures are unremarkable. IMPRESSION: Negative Electronically Signed   By: Deatra RobinsonKevin  Herman M.D.   On: 04/29/2019 02:17    Labs:  CBC: Recent Labs    05/02/19 1408 05/03/19 0220 05/04/19 0959 05/05/19 0632  WBC 21.0* 19.6* 12.0* 9.4  HGB 8.3* 7.3* 7.1* 6.5*  HCT 23.3* 21.3* 20.0* 18.3*  PLT 238 261 294 309    COAGS: Recent Labs    04/29/19 0044  INR 1.0    BMP: Recent Labs    04/29/19 0429 04/30/19 0232 05/04/19 0959 05/05/19 0632  NA 138 138 134* 136  K 3.9 3.8 2.9* 3.5  CL 104 104 96* 101  CO2 23 24 27 27   GLUCOSE 128* 123* 127* 105*  BUN 10 8 6  <5*  CALCIUM 9.2 8.6* 8.3* 8.2*  CREATININE 1.03* 0.85 0.84 0.76  GFRNONAA >60 >60 >60 >60  GFRAA >60 >60 >60 >60    LIVER FUNCTION TESTS: Recent Labs    04/29/19 0044 04/29/19 0429  BILITOT 0.4 0.4  AST 112* 105*  ALT 77* 77*  ALKPHOS 60 59  PROT 7.2 7.1  ALBUMIN 3.6 3.7    TUMOR MARKERS: No results for input(s): AFPTM, CEA, CA199, CHROMGRNA in the last 8760  hours.  Assessment and Plan: Renal laceration IR consulted for angiogram with possible embolization of Grade 3-4 renal laceration.   Patient afebrile.  WBC improved-- was 21K, now 9.4 Hemoglobin dropped from 11.3K to 6.5.  SCr 0.76  Case and imaging reviewed by Dr. Antony OdeaYamgata.  Patient approved for procedure.  Lengthy discussion with patient and her boyfriend.  She is agreeable to proceed.   Risks and benefits were discussed with the patient including, but not limited to bleeding, infection, vascular injury or contrast induced renal failure.  This interventional procedure involves the use of X-rays and because of the nature of the planned procedure, it is possible that we will have prolonged use of X-ray fluoroscopy.  Potential radiation risks to you include (but are not limited to) the following: - A slightly elevated risk for cancer  several years  later in life. This risk is typically less than 0.5% percent. This risk is low in comparison to the normal incidence of human cancer, which is 33% for women and 50% for men according to the [employer]. - Radiation induced injury can include skin redness, resembling a rash, tissue breakdown / ulcers and hair loss (which can be temporary or permanent).   The likelihood of either of these occurring depends on the difficulty of the procedure and whether you are sensitive to radiation due to previous procedures, disease, or genetic conditions.   IF your procedure requires a prolonged use of radiation, you will be notified and given written instructions for further action.  It is your responsibility to monitor the irradiated area for the 2 weeks following the procedure and to notify your physician if you are concerned that you have suffered a radiation induced injury.    All of the patient's questions were answered, patient is agreeable to proceed.  Consent signed and in chart.  Thank you for this interesting consult.  I greatly  enjoyed meeting ANUSHA CLAUS and look forward to participating in their care.  A copy of this report was sent to the requesting provider on this date.  Electronically Signed: Hoyt Koch, PA 05/05/2019, 12:29 PM   I spent a total of 40 Minutes    in face to face in clinical consultation, greater than 50% of which was counseling/coordinating care for renal laceration

## 2019-05-05 NOTE — Sedation Documentation (Signed)
Notified Dr Kathlene Cote that EtCO2 is 48-52.  OK to cont with moderate sedation.  Order received for toradol 30mg  IV x1

## 2019-05-06 LAB — BASIC METABOLIC PANEL
Anion gap: 9 (ref 5–15)
BUN: 6 mg/dL (ref 6–20)
CO2: 25 mmol/L (ref 22–32)
Calcium: 8.4 mg/dL — ABNORMAL LOW (ref 8.9–10.3)
Chloride: 102 mmol/L (ref 98–111)
Creatinine, Ser: 0.84 mg/dL (ref 0.44–1.00)
GFR calc Af Amer: >60 mL/min (ref >60)
GFR calc non Af Amer: >60 mL/min (ref >60)
Glucose, Bld: 131 mg/dL — ABNORMAL HIGH (ref 70–99)
Potassium: 3.2 mmol/L — ABNORMAL LOW (ref 3.5–5.1)
Sodium: 136 mmol/L (ref 135–145)

## 2019-05-06 LAB — CBC
HCT: 19.3 % — ABNORMAL LOW (ref 36.0–46.0)
HCT: 27 % — ABNORMAL LOW (ref 36.0–46.0)
Hemoglobin: 6.6 g/dL — CL (ref 12.0–15.0)
Hemoglobin: 9.6 g/dL — ABNORMAL LOW (ref 12.0–15.0)
MCH: 27.2 pg (ref 26.0–34.0)
MCH: 28.4 pg (ref 26.0–34.0)
MCHC: 34.2 g/dL (ref 30.0–36.0)
MCHC: 35.6 g/dL (ref 30.0–36.0)
MCV: 79.4 fL — ABNORMAL LOW (ref 80.0–100.0)
MCV: 79.9 fL — ABNORMAL LOW (ref 80.0–100.0)
Platelets: 376 10*3/uL (ref 150–400)
Platelets: 408 10*3/uL — ABNORMAL HIGH (ref 150–400)
RBC: 2.43 MIL/uL — ABNORMAL LOW (ref 3.87–5.11)
RBC: 3.38 MIL/uL — ABNORMAL LOW (ref 3.87–5.11)
RDW: 13 % (ref 11.5–15.5)
RDW: 13.5 % (ref 11.5–15.5)
WBC: 8.4 10*3/uL (ref 4.0–10.5)
WBC: 11.9 10*3/uL — ABNORMAL HIGH (ref 4.0–10.5)
nRBC: 0.4 % — ABNORMAL HIGH (ref 0.0–0.2)
nRBC: 0.4 % — ABNORMAL HIGH (ref 0.0–0.2)

## 2019-05-06 MED ORDER — PHENAZOPYRIDINE HCL 200 MG PO TABS
200.0000 mg | ORAL_TABLET | Freq: Three times a day (TID) | ORAL | Status: DC
  Administered 2019-05-07: 200 mg via ORAL
  Filled 2019-05-06 (×7): qty 1

## 2019-05-06 MED ORDER — SODIUM CHLORIDE 0.9% IV SOLUTION: Freq: Once | INTRAVENOUS | Status: DC
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
## 2019-05-06 NOTE — Progress Notes (Signed)
Pt complained of fullness in her bladder and bloody urine was present in foley drainage bag. Irrigation was performed and several very small clots were present. Will continue to monitor output and appearance.

## 2019-05-06 NOTE — Progress Notes (Signed)
CSW met with patient to engage in SBIRT per trauma protocol. Patient scored a total of 3 on their SBIRT exam. CSW provided psychoeducation on substance use and offered patient with resources as appropriate. Patient reported no known/significant history of alcohol use Patient responded in a guarded manner to discussions of substance use.   Lamonte Richer, LCSW, [place] Worker II [phone]

## 2019-05-06 NOTE — Progress Notes (Signed)
2303: Pt called out stating her belly was hurting. Complaining of tenderness on LT side of lower abd. RN irrigated w/ 300 mL and had output of 400 w/ irrigation. Minimal clots and clear. Pt states she wants foley to be removed. RN advised of possible issues w/ foley removal and that it may have to be replaced. Pt states she understands. Helped pt to bathroom for BM. States relief in pain but still insists on foley removal.   Irrigated once again w/ no clots and pink-tinged output. Total of 725 output since beginning of shift. Bladderscan performed- 84 mL.   On-call provider contacted regarding removal. Advised to remove. Foley removed. Will continue to monitor.   0547: Pt voided 250 mL of amber/cherry urine w/o clots. Denies any pain or bladder fullness.

## 2019-05-07 LAB — BPAM RBC
Blood Product Expiration Date: 202010212359
Blood Product Expiration Date: 202010212359
ISSUE DATE / TIME: 202009261046
ISSUE DATE / TIME: 202009261309
Unit Type and Rh: 7300
Unit Type and Rh: 7300

## 2019-05-07 LAB — CULTURE, BLOOD (ROUTINE X 2)
Culture: NO GROWTH
Culture: NO GROWTH
Special Requests: ADEQUATE
Special Requests: ADEQUATE

## 2019-05-07 LAB — CBC
HCT: 25.6 % — ABNORMAL LOW (ref 36.0–46.0)
Hemoglobin: 9.2 g/dL — ABNORMAL LOW (ref 12.0–15.0)
MCH: 28.5 pg (ref 26.0–34.0)
MCHC: 35.9 g/dL (ref 30.0–36.0)
MCV: 79.3 fL — ABNORMAL LOW (ref 80.0–100.0)
Platelets: 416 10*3/uL — ABNORMAL HIGH (ref 150–400)
RBC: 3.23 MIL/uL — ABNORMAL LOW (ref 3.87–5.11)
RDW: 13.4 % (ref 11.5–15.5)
WBC: 12.8 10*3/uL — ABNORMAL HIGH (ref 4.0–10.5)
nRBC: 0.2 % (ref 0.0–0.2)

## 2019-05-07 LAB — TYPE AND SCREEN
ABO/RH(D): B POS
Antibody Screen: NEGATIVE
Donor AG Type: NEGATIVE
Donor AG Type: NEGATIVE
Unit division: 0
Unit division: 0

## 2019-05-07 NOTE — Progress Notes (Signed)
Patient ID: Victoria Mclaughlin, female   DOB: 06/04/1986, 33 y.o.   MRN: [id number]    Subjective: Pt doing well.  Foley was removed yesterday and she is feeling better since this.  Objective: Vital signs in last 24 hours: Temp:  [99 F (37.2 C)-100.6 F (38.1 C)] 100 F (37.8 C) (09/27 0833) Pulse Rate:  [83-100] 90 (09/27 0833) Resp:  [16-25] 24 (09/27 0833) BP: (112-132)/(72-85) 124/80 (09/27 0833) SpO2:  [97 %-100 %] 97 % (09/27 0833)  Intake/Output from previous day: 09/26 0701 - 09/27 0700 In: 1034 [Blood:434] Out: 2075 [Urine:2075] Intake/Output this shift: Total I/O In: -  Out: 300 [Urine:300]  Physical Exam:  General: Alert and oriented Abd: Still with right sided abdominal tenderness  Lab Results: Recent Labs    05/06/19 0226 05/06/19 1739 05/07/19 0843  HGB 6.6* 9.6* 9.2*  HCT 19.3* 27.0* 25.6*   BMET Recent Labs    05/05/19 0632 05/06/19 0226  NA 136 136  K 3.5 3.2*  CL 101 102  CO2 27 25  GLUCOSE 105* 131*  BUN <5* 6  CREATININE 0.76 0.84  CALCIUM 8.2* 8.4*     Studies/Results:   Assessment/Plan: Grade 4 right renal injury - Hgb now stabilized.  [place] for GU standpoint to begin ambulating.  Will eventually need follow up outpatient imaging with Victoria Mclaughlin.   LOS: 8 days   Victoria Mclaughlin 05/07/2019, 10:12 AM
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
## 2019-05-08 MED ORDER — DOCUSATE SODIUM 100 MG PO CAPS: 100.0000 mg | ORAL_CAPSULE | Freq: Every day | ORAL | 0 refills | Status: AC

## 2019-05-08 MED ORDER — OXYCODONE HCL 5 MG PO TABS: 5.0000 mg | ORAL_TABLET | ORAL | Status: DC | PRN

## 2019-05-08 MED ORDER — ACETAMINOPHEN 325 MG PO TABS: 650.0000 mg | ORAL_TABLET | Freq: Four times a day (QID) | ORAL | Status: DC | PRN

## 2019-05-08 MED ORDER — OXYCODONE HCL 5 MG PO TABS: 5.0000 mg | ORAL_TABLET | Freq: Four times a day (QID) | ORAL | 0 refills | Status: DC | PRN

## 2019-05-08 MED ORDER — DOCUSATE SODIUM 100 MG PO CAPS: 100.0000 mg | ORAL_CAPSULE | Freq: Every day | ORAL | 0 refills | Status: DC

## 2019-05-08 MED FILL — DOK 100 MG CAPS: 100 | 10 days supply | Qty: 10 | Fill #0

## 2019-05-08 MED FILL — oxyCODONE HCL 5 MG TABS: 5 | 5 days supply | Qty: 20 | Fill #0
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
## 2019-05-08 NOTE — Plan of Care (Signed)
  Problem: Health Behavior/Discharge Planning: Goal: Ability to manage health-related needs will improve Outcome: Completed/Met Note: Received discharge order for patient. Discharge instructions reviewed with patient, including new medication/ prescriptions and follow-up appointment, wound care, and any special instructions. At this time patient have stated understanding of discharge instructions. Patient with no further questions at this time. Pt stable in no acute distress. PIV removed, intact. Pt off unit in wheelchair. Nursing care complete.
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
## 2019-05-08 NOTE — Discharge Instructions (Signed)
Pain Medicine Instructions You may need pain medicine after an injury or illness. Two common types of pain medicine are:  Opioid pain medicine. These may be called opioids.  Non-opioid pain medicine. This includes NSAIDs. It is important to follow your doctor's instructions when you are taking pain medicine. Doing this can keep yourself and others safe. How can pain medicine affect me? Pain medicine may not make all of your pain go away. It should make you comfortable enough to:  Move.  Breathe.  Do normal activities. Opioids can cause side effects, such as:  Trouble pooping (constipation).  Feeling sick to your stomach (nausea).  Throwing up (vomiting).  Feeling very sleepy.  Confusion.  Taking the medicine for nonmedical reasons even though taking it hurts your health and well-being (opioid use disorder).  Trouble breathing (respiratory depression). Taking opioids for longer than 3 days raises your risk of these side effects. Taking opioids for a long time can affect how well you can do daily tasks. Taking them for a long time also puts you at risk for:  Car crashes.  Depression.  Suicide.  Heart attack.  Taking too much of the medicine (overdose). This can lead to death. What should I do to stay safe while taking pain medicine? Take your medicine as told  Take pain medicine exactly as told by your doctor. Take it only when you need it.  Write down the times when you take your pain medicine. Look at the times before you take your next dose.  Take other over-the-counter or prescription medicines only as told by your doctor. ? If your pain medicine has acetaminophen in it, do not take any other acetaminophen while you are taking this medicine. Too much can damage the liver.  Get pain medicine prescriptions from only one doctor. Avoid certain activities While you are taking prescription pain medicine, and for 8 hours after your last dose:  Do not drive.  Do  not use machinery.  Do not use power tools.  Do not sign legal documents.  Do not drink alcohol.  Do not take sleeping pills.  Do not take care of children by yourself.  Do not do any activities that involve climbing or being in high places.  Do not go into any body of water unless there is an adult nearby who can watch you and help you if needed. This includes: ? [place]. ? Rivers. ? Oceans. ? Spas. ? Swimming pools.  Keep others safe  Store your medicine as told by your doctor. Keep it where children and pets cannot reach it.  Do not share your pain medicine with anyone.  Do not save any leftover pills. If you have leftover pills, you can: ? Bring them to a take-back program. ? Bring them to a pharmacy that has a drug disposal container. ? Throw them in the trash. Check the medicine label or package insert to see if it is safe to throw it out. If it is safe, take the medicine out of the container. Mix it with something that makes it unusable, such as pet waste. Then put the medicine in the trash. General instructions  Talk with your doctor about other ways to manage your pain.  If you have trouble pooping: ? Drink enough fluid to keep your pee (urine) pale yellow. ? Use a poop (stool) softener as told by your doctor. ? Eat more fruits and vegetables.  Keep all follow-up visits as told by your doctor. This is important. Contact a  doctor if:  Your medicine is not helping with your pain.  You have a rash.  You feel depressed. Get help right away if: Seek medical care right away if you are taking pain medicines and you (or people close to you) notice any of the following:  Trouble breathing.  Breathing that is shorter than normal.  Breathing that is more shallow than normal.  Confusion.  Sleepiness.  Trouble staying awake.  Feeling sick to your stomach.  Throwing up.  Your skin or lips turning pale or bluish in color.  Tongue swelling. If you ever  feel like you may hurt yourself or others, or have thoughts about taking your own life, get help right away. Go to your nearest emergency department or call:  Your local emergency services (911 in the [place]  A suicide crisis helpline, such as the [place] at 2288495483. This is open 24 hours a day. Summary  Take your pain medicine exactly as told by your doctor.  Pain medicine can help lower your pain. It may also cause side effects.  Talk with your doctor about other ways to manage your pain.  Follow your doctor's instructions about how to take your pain medicine and keep others safe. Ask what activities you should avoid while taking pain medicine. This information is not intended to replace advice given to you by your health care provider. Make sure you discuss any questions you have with your health care provider. Document Released: 01/13/2008 Document Revised: 07/09/2017 Document Reviewed: 03/08/2017 Elsevier Patient Education  2020 [employer].    Kidney Laceration  A kidney laceration is a tear or a cut in the kidney. Sometimes, a kidney laceration can be a very serious injury. It can cause a lot of bleeding, and surgery may be needed. Other times, a kidney laceration may be minor, and bed rest may be all that is needed. Either way, treatment in a hospital is almost always required. What are the causes? This condition may be caused by:  A forceful hit to the area around the liver (blunt trauma), such as in a car crash. Blunt trauma can tear the liver even though it does not break the skin.  An injury in which an object goes through the skin and into the liver (penetrating injury), such as a stab or gunshot wound. What are the signs or symptoms? Common symptoms of this condition include:  A swollen and firm abdomen.  Pain in the abdomen. Other symptoms include:  Blood in urine  Bruises on the abdomen.  A fast heartbeat.  Taking quick  breaths.  Feeling weak and dizzy. How is this diagnosed? To diagnose this condition, your health care provider will do a physical exam and ask about any injuries to your abdomen. You may have various tests, such as:  Blood tests. Your blood may be tested every few hours. This will show whether you are losing blood.  CT scan. This test is done to check for laceration or bleeding. How is this treated? Treatment depends on how deep the laceration is and how much bleeding you have. Treatment options include:  Monitoring and bed rest at the hospital. You will have tests often.  Receiving donated blood through an IV tube (transfusion) to replace blood that you have lost. You may need several transfusions.  Surgery to pack gauze pads or special material around the laceration to help it heal or to repair the laceration. Follow these instructions at home:  Take over-the-counter and prescription  medicines only as told by your health care provider. Do not take any other medicines unless you ask your health care provider about them first.  Do not drive or use heavy machinery while taking prescription pain medicines.  Rest and limit your activity as told by your health care provider. It may be several months before you can return to your usual routine. Do not participate in activities that involve physical contact or require extra energy until your health care provider approves.  Keep all follow-up visits as told by your health care provider. This is important. Contact a health care provider if:  Your abdominal pain does not go away.  You feel more weak and tired than usual. Get help right away if:  Your abdominal pain gets worse.  You feel dizzy or very weak.  You have trouble breathing.  You have a fever. This information is not intended to replace advice given to you by your health care provider. Make sure you discuss any questions you have with your health care provider. Document  Released: 08/29/2010 Document Revised: 07/09/2017 Document Reviewed: 03/13/2016 Elsevier Patient Education  2020 [employer].
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
## 2019-05-24 ENCOUNTER — Ambulatory Visit: Payer: Self-pay

## 2019-05-24 ENCOUNTER — Inpatient Hospital Stay: Payer: Self-pay

## 2019-05-25 ENCOUNTER — Ambulatory Visit: Payer: Medicaid Other | Attending: Internal Medicine | Admitting: Internal Medicine

## 2019-05-25 ENCOUNTER — Encounter: Payer: Self-pay | Admitting: Internal Medicine

## 2019-05-25 ENCOUNTER — Other Ambulatory Visit: Payer: Self-pay

## 2019-05-25 VITALS — BP 113/74 | HR 78 | Temp 99.2°F | Resp 16 | Ht 64.0 in | Wt 199.2 lb

## 2019-05-25 DIAGNOSIS — S36113D Laceration of liver, unspecified degree, subsequent encounter: Secondary | ICD-10-CM

## 2019-05-25 DIAGNOSIS — Z2821 Immunization not carried out because of patient refusal: Secondary | ICD-10-CM

## 2019-05-25 DIAGNOSIS — R310 Gross hematuria: Secondary | ICD-10-CM

## 2019-05-25 DIAGNOSIS — S37031A Laceration of right kidney, unspecified degree, initial encounter: Secondary | ICD-10-CM | POA: Diagnosis not present

## 2019-05-25 DIAGNOSIS — D509 Iron deficiency anemia, unspecified: Secondary | ICD-10-CM | POA: Diagnosis not present

## 2019-05-25 DIAGNOSIS — F17210 Nicotine dependence, cigarettes, uncomplicated: Secondary | ICD-10-CM | POA: Insufficient documentation

## 2019-05-25 DIAGNOSIS — Z1331 Encounter for screening for depression: Secondary | ICD-10-CM | POA: Insufficient documentation

## 2019-05-25 DIAGNOSIS — S36113A Laceration of liver, unspecified degree, initial encounter: Secondary | ICD-10-CM | POA: Diagnosis not present

## 2019-05-25 DIAGNOSIS — S37031S Laceration of right kidney, unspecified degree, sequela: Secondary | ICD-10-CM

## 2019-05-25 MED ORDER — OXYCODONE HCL 5 MG PO TABS: 5.0000 mg | ORAL_TABLET | Freq: Three times a day (TID) | ORAL | 0 refills | Status: AC | PRN

## 2019-05-25 NOTE — Patient Instructions (Signed)
Purchase ferrous sulfate 325 mg over-the-counter and take 1 daily.
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
## 2019-05-25 NOTE — Progress Notes (Signed)
Pt states her pain comes at night and when she is walking

## 2019-05-26 LAB — HEPATIC FUNCTION PANEL
ALT: 22 IU/L (ref 0–32)
AST: 15 IU/L (ref 0–40)
Albumin: 4.2 g/dL (ref 3.8–4.8)
Alkaline Phosphatase: 110 IU/L (ref 39–117)
Bilirubin Total: 0.3 mg/dL (ref 0.0–1.2)
Bilirubin, Direct: 0.1 mg/dL (ref 0.00–0.40)
Total Protein: 8.2 g/dL (ref 6.0–8.5)

## 2019-05-26 LAB — CBC
Hematocrit: 36.8 % (ref 34.0–46.6)
Hemoglobin: 11.6 g/dL (ref 11.1–15.9)
MCH: 26.1 pg — ABNORMAL LOW (ref 26.6–33.0)
MCHC: 31.5 g/dL (ref 31.5–35.7)
MCV: 83 fL (ref 79–97)
Platelets: 193 10*3/uL (ref 150–450)
RBC: 4.45 x10E6/uL (ref 3.77–5.28)
RDW: 14.2 % (ref 11.7–15.4)
WBC: 8.9 10*3/uL (ref 3.4–10.8)

## 2019-05-30 ENCOUNTER — Telehealth: Payer: Self-pay

## 2019-05-30 NOTE — Telephone Encounter (Signed)
Pt returned call and I went over results with pt and she doesn't have any questions or concerns

## 2019-05-30 NOTE — Telephone Encounter (Signed)
Contacted pt to go over lab results pt didn't answer lvm asking pt to give me a call at her earliest convenience  

## 2019-06-01 ENCOUNTER — Ambulatory Visit: Payer: Self-pay

## 2019-06-06 DIAGNOSIS — S37061S Major laceration of right kidney, sequela: Secondary | ICD-10-CM | POA: Diagnosis not present

## 2019-06-22 ENCOUNTER — Ambulatory Visit: Payer: Self-pay | Admitting: Internal Medicine

## 2019-08-15 DIAGNOSIS — R3121 Asymptomatic microscopic hematuria: Secondary | ICD-10-CM | POA: Diagnosis not present
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
## 2021-05-28 ENCOUNTER — Emergency Department (HOSPITAL_COMMUNITY)
Admission: EM | Admit: 2021-05-28 | Discharge: 2021-05-29 | Disposition: A | Discharge status: Home / Self Care | Payer: Medicaid Other | Attending: Emergency Medicine | Admitting: Emergency Medicine

## 2021-05-28 ENCOUNTER — Encounter (HOSPITAL_COMMUNITY): Payer: Self-pay | Admitting: Emergency Medicine

## 2021-05-28 ENCOUNTER — Other Ambulatory Visit: Payer: Self-pay

## 2021-05-28 DIAGNOSIS — R55 Syncope and collapse: Secondary | ICD-10-CM | POA: Insufficient documentation

## 2021-05-28 DIAGNOSIS — F17210 Nicotine dependence, cigarettes, uncomplicated: Secondary | ICD-10-CM | POA: Diagnosis not present

## 2021-05-28 DIAGNOSIS — J45909 Unspecified asthma, uncomplicated: Secondary | ICD-10-CM | POA: Diagnosis not present

## 2021-05-28 DIAGNOSIS — R42 Dizziness and giddiness: Secondary | ICD-10-CM | POA: Diagnosis present

## 2021-05-28 LAB — CBG MONITORING, ED: Glucose-Capillary: 112 mg/dL — ABNORMAL HIGH (ref 70–99)

## 2021-05-28 NOTE — ED Triage Notes (Signed)
Patient presents complaining of a dizzy spell with associated fall without injury. Patient states she drank a mixed drink and a glass of wine tonight and after eating her meal, had a dizzy spell. Patient states her ears were ringing and she fell to the ground. No injury. Patient did not has LOC.
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
## 2021-05-29 LAB — CBC
HCT: 37.5 % (ref 36.0–46.0)
Hemoglobin: 12.8 g/dL (ref 12.0–15.0)
MCH: 27.4 pg (ref 26.0–34.0)
MCHC: 34.1 g/dL (ref 30.0–36.0)
MCV: 80.1 fL (ref 80.0–100.0)
Platelets: 300 10*3/uL (ref 150–400)
RBC: 4.68 MIL/uL (ref 3.87–5.11)
RDW: 13.2 % (ref 11.5–15.5)
WBC: 10.8 10*3/uL — ABNORMAL HIGH (ref 4.0–10.5)
nRBC: 0 % (ref 0.0–0.2)

## 2021-05-29 LAB — BASIC METABOLIC PANEL
Anion gap: 5 (ref 5–15)
BUN: 10 mg/dL (ref 6–20)
CO2: 27 mmol/L (ref 22–32)
Calcium: 9.4 mg/dL (ref 8.9–10.3)
Chloride: 105 mmol/L (ref 98–111)
Creatinine, Ser: 0.79 mg/dL (ref 0.44–1.00)
GFR, Estimated: >60 mL/min (ref >60)
Glucose, Bld: 101 mg/dL — ABNORMAL HIGH (ref 70–99)
Potassium: 3.6 mmol/L (ref 3.5–5.1)
Sodium: 137 mmol/L (ref 135–145)

## 2021-05-29 LAB — HCG, QUANTITATIVE, PREGNANCY: hCG, Beta Chain, Quant, S: <1 m[IU]/mL (ref <5)

## 2021-05-29 NOTE — ED Notes (Signed)
Pt ambulated in hall without dizziness.

## 2021-05-30 ENCOUNTER — Telehealth: Payer: Self-pay

## 2021-05-30 NOTE — Telephone Encounter (Signed)
Transition Care Management Follow-up Telephone Call Date of discharge and from where: 05/29/2021-[facility] How have you been since you were released from the hospital? Patient stated she is doing fine.  Any questions or concerns? No  Items Reviewed: Did the pt receive and understand the discharge instructions provided? Yes  Medications obtained and verified?  No medications given at discharge  Other? No  Any new allergies since your discharge? No  Dietary orders reviewed? No Do you have support at home? Yes   Home Care and Equipment/Supplies: Were home health services ordered? not applicable If so, what is the name of the agency? N/A  Has the agency set up a time to come to the patient's home? not applicable Were any new equipment or medical supplies ordered?  No What is the name of the medical supply agency? N/A Were you able to get the supplies/equipment? not applicable Do you have any questions related to the use of the equipment or supplies? No  Functional Questionnaire: (I = Independent and D = Dependent) ADLs: I  Bathing/Dressing- I  Meal Prep- I  Eating- I  Maintaining continence- I  Transferring/Ambulation- I  Managing Meds- I  Follow up appointments reviewed:  PCP Hospital f/u appt confirmed? No   Specialist Hospital f/u appt confirmed? No   Are transportation arrangements needed? No  If their condition worsens, is the pt aware to call PCP or go to the Emergency Dept.? Yes Was the patient provided with contact information for the PCP's office or ED? Yes Was to pt encouraged to call back with questions or concerns? Yes

## 2022-02-21 ENCOUNTER — Encounter (HOSPITAL_COMMUNITY): Payer: Self-pay

## 2022-02-21 ENCOUNTER — Emergency Department (HOSPITAL_COMMUNITY)
Admission: EM | Admit: 2022-02-21 | Discharge: 2022-02-21 | Disposition: A | Discharge status: Home / Self Care | Payer: Medicaid Other | Attending: Emergency Medicine | Admitting: Emergency Medicine

## 2022-02-21 ENCOUNTER — Other Ambulatory Visit: Payer: Self-pay

## 2022-02-21 DIAGNOSIS — R9431 Abnormal electrocardiogram [ECG] [EKG]: Secondary | ICD-10-CM | POA: Diagnosis not present

## 2022-02-21 DIAGNOSIS — T782XXA Anaphylactic shock, unspecified, initial encounter: Secondary | ICD-10-CM | POA: Diagnosis not present

## 2022-02-21 DIAGNOSIS — L509 Urticaria, unspecified: Secondary | ICD-10-CM | POA: Insufficient documentation

## 2022-02-21 DIAGNOSIS — R0600 Dyspnea, unspecified: Secondary | ICD-10-CM | POA: Diagnosis not present

## 2022-02-21 DIAGNOSIS — T7840XA Allergy, unspecified, initial encounter: Secondary | ICD-10-CM | POA: Diagnosis not present

## 2022-02-21 LAB — I-STAT CHEM 8, ED
BUN: 10 mg/dL (ref 6–20)
Calcium, Ion: 1.11 mmol/L — ABNORMAL LOW (ref 1.15–1.40)
Chloride: 106 mmol/L (ref 98–111)
Creatinine, Ser: 0.8 mg/dL (ref 0.44–1.00)
Glucose, Bld: 151 mg/dL — ABNORMAL HIGH (ref 70–99)
HCT: 38 % (ref 36.0–46.0)
Hemoglobin: 12.9 g/dL (ref 12.0–15.0)
Potassium: 3.8 mmol/L (ref 3.5–5.1)
Sodium: 140 mmol/L (ref 135–145)
TCO2: 24 mmol/L (ref 22–32)

## 2022-02-21 LAB — I-STAT BETA HCG BLOOD, ED (MC, WL, AP ONLY): I-stat hCG, quantitative: <5 m[IU]/mL (ref <5)

## 2022-02-21 MED ORDER — METHYLPREDNISOLONE SODIUM SUCC 125 MG IJ SOLR
125.0000 mg | Freq: Once | INTRAMUSCULAR | Status: AC
  Administered 2022-02-21: 125 mg via INTRAVENOUS
  Filled 2022-02-21: qty 2

## 2022-02-21 MED ORDER — DIPHENHYDRAMINE HCL 50 MG/ML IJ SOLN
50.0000 mg | Freq: Once | INTRAMUSCULAR | Status: AC
  Administered 2022-02-21: 50 mg via INTRAVENOUS
  Filled 2022-02-21: qty 1

## 2022-02-21 MED ORDER — EPINEPHRINE 0.3 MG/0.3ML IJ SOAJ: 0.3000 mg | INTRAMUSCULAR | 1 refills | Status: AC | PRN

## 2022-02-21 MED ORDER — SODIUM CHLORIDE 0.9 % IV BOLUS: 1000.0000 mL | Freq: Once | INTRAVENOUS | Status: DC

## 2022-02-21 MED ORDER — EPINEPHRINE 0.3 MG/0.3ML IJ SOAJ: 0.3000 mg | Freq: Once | INTRAMUSCULAR | Status: DC

## 2022-02-21 MED ORDER — DEXAMETHASONE SODIUM PHOSPHATE 10 MG/ML IJ SOLN
10.0000 mg | Freq: Once | INTRAMUSCULAR | Status: DC
  Filled 2022-02-21: qty 1

## 2022-02-21 MED ORDER — LORATADINE 10 MG PO TABS: 10.0000 mg | ORAL_TABLET | Freq: Every day | ORAL | Status: DC

## 2022-02-21 MED ORDER — FAMOTIDINE IN NACL 20-0.9 MG/50ML-% IV SOLN: 20.0000 mg | Freq: Once | INTRAVENOUS | Status: DC

## 2022-02-21 MED ORDER — FAMOTIDINE IN NACL 20-0.9 MG/50ML-% IV SOLN
20.0000 mg | Freq: Once | INTRAVENOUS | Status: AC
  Administered 2022-02-21: 20 mg via INTRAVENOUS
  Filled 2022-02-21: qty 50

## 2022-02-21 MED ORDER — EPINEPHRINE 0.3 MG/0.3ML IJ SOAJ
0.3000 mg | Freq: Once | INTRAMUSCULAR | Status: AC
  Administered 2022-02-21: 0.3 mg via INTRAMUSCULAR
  Filled 2022-02-21: qty 0.3

## 2022-02-21 NOTE — Discharge Instructions (Signed)
Follow-up with primary doctor for referral to an allergist. Use Benadryl every 6 hours as needed for itching or hives.  Use EpiPen as discussed when needed and if you use it make sure you have someone bring it to the hospital or call the ambulance.
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
## 2022-02-21 NOTE — ED Provider Notes (Signed)
Patient care signed out to observe and reassess after significant allergic reaction.  On reassessment patient well-appearing, no significant skin or breathing signs or symptoms.  Patient requesting is comfortable going home and will use Benadryl as needed.  EpiPen prescription given.  Follow-up with allergist discussed.  Kenton Kingfisher, MD 02/21/22 [phone]
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
